# Patient Record
Sex: Male | Born: 1985 | Race: Black or African American | Hispanic: No | Marital: Single | Smoking: Current every day smoker
Health system: Southern US, Community
[De-identification: ages and names within clinical notes are randomized; demographics above are authoritative.]

## PROBLEM LIST (undated history)

## (undated) DIAGNOSIS — I10 Essential (primary) hypertension: Secondary | ICD-10-CM

## (undated) HISTORY — PX: INCISE AND DRAIN ABCESS: PRO64

---

## 2018-01-26 ENCOUNTER — Encounter (HOSPITAL_COMMUNITY): Payer: Self-pay | Admitting: Emergency Medicine

## 2018-01-26 ENCOUNTER — Other Ambulatory Visit: Payer: Self-pay

## 2018-01-26 ENCOUNTER — Emergency Department (HOSPITAL_COMMUNITY)
Admission: EM | Admit: 2018-01-26 | Discharge: 2018-01-26 | Disposition: A | Discharge status: Home / Self Care | Payer: Medicaid Other | Attending: Emergency Medicine | Admitting: Emergency Medicine

## 2018-01-26 DIAGNOSIS — M545 Low back pain: Secondary | ICD-10-CM | POA: Diagnosis present

## 2018-01-26 DIAGNOSIS — M5442 Lumbago with sciatica, left side: Secondary | ICD-10-CM | POA: Insufficient documentation

## 2018-01-26 DIAGNOSIS — I10 Essential (primary) hypertension: Secondary | ICD-10-CM | POA: Insufficient documentation

## 2018-01-26 DIAGNOSIS — F17210 Nicotine dependence, cigarettes, uncomplicated: Secondary | ICD-10-CM | POA: Diagnosis not present

## 2018-01-26 HISTORY — DX: Essential (primary) hypertension: I10

## 2018-01-26 MED ORDER — METHOCARBAMOL 500 MG PO TABS: 500.0000 mg | ORAL_TABLET | Freq: Two times a day (BID) | ORAL | 0 refills | Status: DC

## 2018-01-26 MED ORDER — PREDNISONE 20 MG PO TABS
60.0000 mg | ORAL_TABLET | Freq: Once | ORAL | Status: AC
  Administered 2018-01-26: 60 mg via ORAL
  Filled 2018-01-26: qty 3

## 2018-01-26 MED ORDER — KETOROLAC TROMETHAMINE 60 MG/2ML IM SOLN
15.0000 mg | Freq: Once | INTRAMUSCULAR | Status: AC
  Administered 2018-01-26: 15 mg via INTRAMUSCULAR
  Filled 2018-01-26: qty 2

## 2018-01-26 NOTE — ED Notes (Signed)
ED Provider at bedside. 

## 2018-01-26 NOTE — ED Triage Notes (Signed)
Pt has known DDD and was told he had a bulging disc in his lumbar spine. Pain down left thigh and knee.  No bowel or bladder symptoms. Consistent pain. Hard to lift left leg.

## 2018-01-26 NOTE — ED Notes (Signed)
Pt ambulatory with limp from triage room to exam room.

## 2018-01-26 NOTE — ED Provider Notes (Signed)
[facility] EMERGENCY DEPARTMENT Provider Note   CSN: [id number] Arrival date & time: 01/26/18  1940     History   Chief Complaint Chief Complaint  Patient presents with  . Back Pain    HPI CAIDENCE Paul Rios is a 32 y.o. male presenting for left-sided lower back pain that radiates down to his left thigh.  Patient states that he has had similar pain in the past and has been managed by [facility] for this pain.  Patient states that he last visited [facility] in March and received tramadol, prednisone for his pain which improved.  Patient states that this current episode of pain again 2 weeks ago and has gradually worsened.  Patient states that his pain is a sharp pain that starts in his left gluteal area and radiates down to his left anterior thigh.  Patient has tried over-the-counter inflammatories for pain but states that these have not helped.    Patient denies any new injury/trauma, patient denies fever, urinary incontinence, fecal incontinence, numbness or tingling or weakness.  Patient states that he has increased pain with lifting his left leg, denies numbness or weakness, just states that it is painful when he does so. Patient denies history of diabetes, gastric ulcers, kidney disease.  Patient states that the last time he received steroids was in March.  HPI  Past Medical History:  Diagnosis Date  . Hypertension     There are no active problems to display for this patient.   History reviewed. No pertinent surgical history.      Home Medications    Prior to Admission medications   Medication Sig Start Date End Date Taking? Authorizing Provider  methocarbamol (ROBAXIN) 500 MG tablet Take 1 tablet (500 mg total) by mouth 2 (two) times daily. 01/26/18   Bill Salinas, PA-C    Family History No family history on file.  Social History Social History   Tobacco Use  . Smoking status: Current Every Day Smoker    Packs/day: 0.50      Types: Cigarettes  . Smokeless tobacco: Never Used  Substance Use Topics  . Alcohol use: Not on file  . Drug use: Not on file     Allergies   Augmentin [amoxicillin-pot clavulanate]   Review of Systems Review of Systems  Constitutional: Negative.  Negative for chills, fatigue and fever.  HENT: Negative.   Eyes: Negative.   Respiratory: Negative.  Negative for cough, chest tightness and shortness of breath.   Cardiovascular: Negative.  Negative for chest pain and leg swelling.  Gastrointestinal: Negative.  Negative for abdominal pain, diarrhea, nausea and vomiting.  Genitourinary: Negative.  Negative for difficulty urinating, dysuria and hematuria.  Musculoskeletal: Positive for back pain. Negative for joint swelling, neck pain and neck stiffness.  Skin: Negative.  Negative for rash and wound.  Neurological: Negative.  Negative for dizziness, syncope, weakness, light-headedness, numbness and headaches.     Physical Exam Updated Vital Signs BP 132/83 (BP Location: Right Arm)   Pulse (!) 50   Temp 98.7 F (37.1 C) (Oral)   Resp 18   SpO2 98%   Physical Exam  Constitutional: He is oriented to person, place, and time. He appears well-developed and well-nourished. No distress.  HENT:  Head: Normocephalic and atraumatic.  Right Ear: External ear normal.  Left Ear: External ear normal.  Nose: Nose normal.  Eyes: Pupils are equal, round, and reactive to light.  Neck: Normal range of motion. Neck supple. No JVD present.  No tracheal deviation present.  Cardiovascular: Normal rate and regular rhythm.  Pulmonary/Chest: Effort normal and breath sounds normal. No respiratory distress.  Abdominal: Soft. There is no tenderness. There is no rebound and no guarding.  Musculoskeletal: Normal range of motion.       Back:  Positive straight leg test, left side. No midline spine TTP, no paraspinal muscle tenderness, no deformity, crepitus, or step-off noted. Sensation and strength  fully intact to patient's lower extremities bilaterally.  Patient is able to extend and flex his legs bilaterally.    Neurological: He is alert and oriented to person, place, and time. He has normal strength. No cranial nerve deficit or sensory deficit.  Normal tone. 5/5 strength of BUE and BLE major muscle groups including strong and equal grip strength and dorsiflexion/plantar flexion Sensory: light touch normal in all extremities. Gait: normal gait and balance. CV: 2+ radial and DP/PT pulses  Skin: Skin is warm and dry.  Psychiatric: He has a normal mood and affect. His behavior is normal.     ED Treatments / Results  Labs (all labs ordered are listed, but only abnormal results are displayed) Labs Reviewed - No data to display  EKG None  Radiology No results found.  Procedures Procedures (including critical care time)  Medications Ordered in ED Medications  ketorolac (TORADOL) injection 15 mg (15 mg Intramuscular Given 01/26/18 2123)  predniSONE (DELTASONE) tablet 60 mg (60 mg Oral Given 01/26/18 2124)     Initial Impression / Assessment and Plan / ED Course  I have reviewed the triage vital signs and the nursing notes.  Pertinent labs & imaging results that were available during my care of the patient were reviewed by me and considered in my medical decision making (see chart for details).  Clinical Course as of Jan 28 255  Thu Jan 26, 2018  2221 On reassessment patient is sleeping in room, patient states that his pain is improved and he is ready to be discharged.   [BM]    Clinical Course User Index [BM] Bill Salinas, PA-C   Patient with back pain.  No neurological deficits and normal neuro exam.  Patient can walk but states is painful.  No loss of bowel or bladder control.  No concern for cauda equina.  No fever, night sweats, weight loss, h/o cancer, IVDU.  Patient denies new injury. Patient managed by [facility] this same problem.  Patient states  that he has an appointment with [facility] at the end of this month.  Patient encouraged to keep this appointment.  Pain controlled in the department with Toradol and prednisone.  Patient states that pain is improved at this time and is ready for discharge.  Patient states that he will follow-up with [facility] for this complaint.  I have given the patient a prescription for Robaxin, patient informed not to drive or operate heavy machinery while taking muscle relaxers.  At this time there does not appear to be any evidence of an acute emergency medical condition and the patient appears stable for discharge with appropriate outpatient follow up. Diagnosis was discussed with patient who verbalizes understanding and is agreeable to discharge. I have discussed return precautions with patient and family who verbalize understanding of return precautions. Patient strongly encouraged to follow-up with their PCP.  This note was dictated using DragonOne dictation software; please contact for any inconsistencies within the note.   Final Clinical Impressions(s) / ED Diagnoses   Final diagnoses:  Acute left-sided low back  pain with left-sided sciatica    ED Discharge Orders        Ordered    methocarbamol (ROBAXIN) 500 MG tablet  2 times daily     01/26/18 2223       Elizabeth PalauMorelli, Brandon A, PA-C 01/27/18 0258    Jacalyn LefevreHaviland, Julie, MD 01/31/18 1810

## 2018-01-26 NOTE — Discharge Instructions (Addendum)
Please follow-up with your primary care provider regarding your visit today. [facility] further evaluation of your pain. You may use Robaxin as prescribed for pain, do not drive or operate heavy machinery while taking Robaxin. Please take Ibuprofen (Advil, motrin) and Tylenol (acetaminophen) to relieve your pain.  You may take up to 400 MG (2 pills) of normal strength ibuprofen every 8 hours as needed.  In between doses of ibuprofen you make take tylenol, up to 500 mg (one extra strength pill).  Do not take more than 3,000 mg tylenol in a 24 hour period.  Please check all medication labels as many medications such as pain and cold medications may contain tylenol.  Do not drink alcohol while taking these medications.  Do not take other NSAID'S while taking ibuprofen (such as aleve or naproxen).  Please take ibuprofen with food to decrease stomach upset. Please return to the emergency department for any new or worsening symptoms.  Contact a doctor if: You have pain that: Wakes you up when you are sleeping. Gets worse when you lie down. Is worse than the pain you have had in the past. Lasts longer than 4 weeks. You lose weight for without trying. Get help right away if: You cannot control when you pee (urinate) or poop (have a bowel movement). You have weakness in any of these areas and it gets worse. Lower back. Lower belly (pelvis). Butt (buttocks). Legs. You have redness or swelling of your back. You have a burning feeling when you pee.

## 2018-01-26 NOTE — ED Notes (Signed)
Patient verbalizes understanding of discharge instructions. Opportunity for questioning and answers were provided. Armband removed by staff, pt discharged from ED ambulatory.   

## 2018-01-31 ENCOUNTER — Other Ambulatory Visit: Payer: Self-pay | Admitting: Orthopedic Surgery

## 2018-01-31 DIAGNOSIS — M5416 Radiculopathy, lumbar region: Secondary | ICD-10-CM

## 2018-02-01 ENCOUNTER — Ambulatory Visit
Admission: RE | Admit: 2018-02-01 | Discharge: 2018-02-01 | Disposition: A | Discharge status: Home / Self Care | Payer: Medicaid Other | Source: Ambulatory Visit | Attending: Orthopedic Surgery | Admitting: Orthopedic Surgery

## 2018-02-01 DIAGNOSIS — M5416 Radiculopathy, lumbar region: Secondary | ICD-10-CM

## 2018-03-16 ENCOUNTER — Other Ambulatory Visit: Payer: Self-pay | Admitting: Orthopedic Surgery

## 2018-03-17 NOTE — Pre-Procedure Instructions (Signed)
Paul Rios  03/17/2018      [id number] [place]- [place], [place] - [address] [facility] [address] [place] [id number] Phone: [phone] Fax: [phone]    Your procedure is scheduled on September 4  [facility].M[facility]  Call this number if you have problems the morning of surgery:  [phone]   Remember:  Do not eat or drink after midnight.     Take these medicines the morning of surgery with A SIP OF WATER  gabapentin (NEURONTIN)  HYDROcodone-acetaminophen (NORCO)   7 days prior to surgery STOP taking any Aspirin(unless otherwise instructed by your surgeon), Aleve, Naproxen, Ibuprofen, Motrin, Advil, Goody's, BC's, all herbal medications, fish oil, and all vitamins     Do not wear jewelry  Do not wear lotions, powders, or cologne, or deodorant.  Men may shave face and neck.  Do not bring valuables to the hospital.  [facility] is not responsible for any belongings or valuables.  Contacts, dentures or bridgework may not be worn into surgery.  Leave your suitcase in the car.  After surgery it may be brought to your room.  For patients admitted to the hospital, discharge time will be determined by your treatment team.  Patients discharged the day of surgery will not be allowed to drive home.    Special instructions:   [facility]- Preparing For Surgery  Before surgery, you can play an important role. Because skin is not sterile, your skin needs to be as free of germs as possible. You can reduce the number of germs on your skin by washing with CHG (chlorahexidine gluconate) Soap before surgery.  CHG is an antiseptic cleaner which kills germs and bonds with the skin to continue killing germs even after washing.    Oral Hygiene is also important to reduce your risk of infection.  Remember - BRUSH YOUR TEETH THE MORNING OF SURGERY WITH YOUR REGULAR TOOTHPASTE  Please do not  use if you have an allergy to CHG or antibacterial soaps. If your skin becomes reddened/irritated stop using the CHG.  Do not shave (including legs and underarms) for at least 48 hours prior to first CHG shower. It is OK to shave your face.  Please follow these instructions carefully.   1. Shower the NIGHT BEFORE SURGERY and the MORNING OF SURGERY with CHG.   2. If you chose to wash your hair, wash your hair first as usual with your normal shampoo.  3. After you shampoo, rinse your hair and body thoroughly to remove the shampoo.  4. Use CHG as you would any other liquid soap. You can apply CHG directly to the skin and wash gently with a scrungie or a clean washcloth.   5. Apply the CHG Soap to your body ONLY FROM THE NECK DOWN.  Do not use on open wounds or open sores. Avoid contact with your eyes, ears, mouth and genitals (private parts). Wash Face and genitals (private parts)  with your normal soap.  6. Wash thoroughly, paying special attention to the area where your surgery will be performed.  7. Thoroughly rinse your body with warm water from the neck down.  8. DO NOT shower/wash with your normal soap after using and rinsing off the CHG Soap.  9. Pat yourself dry with a CLEAN TOWEL.  10. Wear CLEAN PAJAMAS to bed the night before surgery, wear comfortable clothes  the morning of surgery  11. Place CLEAN SHEETS on your bed the night of your first shower and DO NOT SLEEP WITH PETS.    Day of Surgery:  Do not apply any deodorants/lotions.  Please wear clean clothes to the hospital/surgery center.   Remember to brush your teeth WITH YOUR REGULAR TOOTHPASTE.    Please read over the following fact sheets that you were given.

## 2018-03-21 ENCOUNTER — Other Ambulatory Visit: Payer: Self-pay

## 2018-03-21 ENCOUNTER — Encounter (HOSPITAL_COMMUNITY)
Admission: RE | Admit: 2018-03-21 | Discharge: 2018-03-21 | Disposition: A | Discharge status: Home / Self Care | Payer: Medicaid Other | Source: Ambulatory Visit | Attending: Orthopedic Surgery | Admitting: Orthopedic Surgery

## 2018-03-21 ENCOUNTER — Ambulatory Visit (HOSPITAL_COMMUNITY)
Admission: RE | Admit: 2018-03-21 | Discharge: 2018-03-21 | Disposition: A | Discharge status: Home / Self Care | Payer: Medicaid Other | Source: Ambulatory Visit | Attending: Orthopedic Surgery | Admitting: Orthopedic Surgery

## 2018-03-21 ENCOUNTER — Encounter (HOSPITAL_COMMUNITY): Payer: Self-pay

## 2018-03-21 DIAGNOSIS — R9431 Abnormal electrocardiogram [ECG] [EKG]: Secondary | ICD-10-CM | POA: Insufficient documentation

## 2018-03-21 DIAGNOSIS — Z01818 Encounter for other preprocedural examination: Secondary | ICD-10-CM

## 2018-03-21 LAB — TYPE AND SCREEN
ABO/RH(D): A POS
Antibody Screen: NEGATIVE

## 2018-03-21 LAB — URINALYSIS, ROUTINE W REFLEX MICROSCOPIC
Bilirubin Urine: NEGATIVE
GLUCOSE, UA: NEGATIVE mg/dL
HGB URINE DIPSTICK: NEGATIVE
KETONES UR: NEGATIVE mg/dL
Leukocytes, UA: NEGATIVE
Nitrite: NEGATIVE
PROTEIN: NEGATIVE mg/dL
Specific Gravity, Urine: 1.018 (ref 1.005–1.030)
pH: 7 (ref 5.0–8.0)

## 2018-03-21 LAB — CBC WITH DIFFERENTIAL/PLATELET
ABS IMMATURE GRANULOCYTES: 0 10*3/uL (ref 0.0–0.1)
BASOS ABS: 0.1 10*3/uL (ref 0.0–0.1)
Basophils Relative: 1 %
Eosinophils Absolute: 0.1 10*3/uL (ref 0.0–0.7)
Eosinophils Relative: 2 %
HCT: 44.3 % (ref 39.0–52.0)
Hemoglobin: 14.5 g/dL (ref 13.0–17.0)
IMMATURE GRANULOCYTES: 0 %
Lymphocytes Relative: 30 %
Lymphs Abs: 2.4 10*3/uL (ref 0.7–4.0)
MCH: 28.8 pg (ref 26.0–34.0)
MCHC: 32.7 g/dL (ref 30.0–36.0)
MCV: 87.9 fL (ref 78.0–100.0)
MONO ABS: 0.6 10*3/uL (ref 0.1–1.0)
Monocytes Relative: 7 %
Neutro Abs: 4.9 10*3/uL (ref 1.7–7.7)
Neutrophils Relative %: 60 %
PLATELETS: 215 10*3/uL (ref 150–400)
RBC: 5.04 MIL/uL (ref 4.22–5.81)
RDW: 12.6 % (ref 11.5–15.5)
WBC: 8.1 10*3/uL (ref 4.0–10.5)

## 2018-03-21 LAB — COMPREHENSIVE METABOLIC PANEL
ALBUMIN: 3.9 g/dL (ref 3.5–5.0)
ALT: 28 U/L (ref 0–44)
AST: 29 U/L (ref 15–41)
Alkaline Phosphatase: 41 U/L (ref 38–126)
Anion gap: 7 (ref 5–15)
BUN: 9 mg/dL (ref 6–20)
CHLORIDE: 107 mmol/L (ref 98–111)
CO2: 26 mmol/L (ref 22–32)
Calcium: 9.3 mg/dL (ref 8.9–10.3)
Creatinine, Ser: 1.1 mg/dL (ref 0.61–1.24)
GFR calc non Af Amer: >60 mL/min (ref >60)
GLUCOSE: 93 mg/dL (ref 70–99)
Potassium: 3.9 mmol/L (ref 3.5–5.1)
SODIUM: 140 mmol/L (ref 135–145)
Total Bilirubin: 0.5 mg/dL (ref 0.3–1.2)
Total Protein: 7 g/dL (ref 6.5–8.1)

## 2018-03-21 LAB — SURGICAL PCR SCREEN
MRSA, PCR: NEGATIVE
Staphylococcus aureus: POSITIVE — AB

## 2018-03-21 LAB — APTT: APTT: 33 s (ref 24–36)

## 2018-03-21 LAB — ABO/RH: ABO/RH(D): A POS

## 2018-03-21 LAB — PROTIME-INR
INR: 1
Prothrombin Time: 13.1 seconds (ref 11.4–15.2)

## 2018-03-21 NOTE — Progress Notes (Signed)
PCP - none  Chest x-ray -  03/21/18 EKG -  03/21/18  Blood Thinner Instructions: N/A Aspirin Instructions:  Anesthesia review: none  Patient denies shortness of breath, fever, cough and chest pain at PAT appointment   Patient verbalized understanding of instructions that were given to them at the PAT appointment. Patient was also instructed that they will need to review over the PAT instructions again at home before surgery.

## 2018-03-21 NOTE — Progress Notes (Signed)
   03/21/18 1107  OBSTRUCTIVE SLEEP APNEA  Have you ever been diagnosed with sleep apnea through a sleep study? No  Do you snore loudly (loud enough to be heard through closed doors)?  1  Do you often feel tired, fatigued, or sleepy during the daytime (such as falling asleep during driving or talking to someone)? 0  Has anyone observed you stop breathing during your sleep? 1  Do you have, or are you being treated for high blood pressure? 0  BMI more than 35 kg/m2? 1  Age > 50 (1-yes) 0  Neck circumference greater than:Male 16 inches or larger, Male 17inches or larger? 1  Male Gender (Yes=1) 1  Obstructive Sleep Apnea Score 5  Score 5 or greater  No PCP

## 2018-03-22 ENCOUNTER — Ambulatory Visit (HOSPITAL_COMMUNITY)
Admission: RE | Disposition: A | Discharge status: Home / Self Care | Payer: Self-pay | Source: Ambulatory Visit | Attending: Orthopedic Surgery

## 2018-03-22 ENCOUNTER — Ambulatory Visit (HOSPITAL_COMMUNITY): Payer: Medicaid Other | Admitting: Anesthesiology

## 2018-03-22 ENCOUNTER — Ambulatory Visit (HOSPITAL_COMMUNITY)
Admission: RE | Admit: 2018-03-22 | Discharge: 2018-03-22 | Disposition: A | Discharge status: Home / Self Care | Payer: Medicaid Other | Source: Ambulatory Visit | Attending: Orthopedic Surgery | Admitting: Orthopedic Surgery

## 2018-03-22 ENCOUNTER — Ambulatory Visit (HOSPITAL_COMMUNITY): Payer: Medicaid Other

## 2018-03-22 ENCOUNTER — Encounter (HOSPITAL_COMMUNITY): Payer: Self-pay | Admitting: Surgery

## 2018-03-22 DIAGNOSIS — F17210 Nicotine dependence, cigarettes, uncomplicated: Secondary | ICD-10-CM | POA: Insufficient documentation

## 2018-03-22 DIAGNOSIS — Z6839 Body mass index (BMI) 39.0-39.9, adult: Secondary | ICD-10-CM | POA: Insufficient documentation

## 2018-03-22 DIAGNOSIS — Z880 Allergy status to penicillin: Secondary | ICD-10-CM | POA: Insufficient documentation

## 2018-03-22 DIAGNOSIS — M5117 Intervertebral disc disorders with radiculopathy, lumbosacral region: Secondary | ICD-10-CM | POA: Diagnosis not present

## 2018-03-22 DIAGNOSIS — I10 Essential (primary) hypertension: Secondary | ICD-10-CM | POA: Insufficient documentation

## 2018-03-22 DIAGNOSIS — Z79899 Other long term (current) drug therapy: Secondary | ICD-10-CM | POA: Diagnosis not present

## 2018-03-22 DIAGNOSIS — Z419 Encounter for procedure for purposes other than remedying health state, unspecified: Secondary | ICD-10-CM

## 2018-03-22 HISTORY — PX: LUMBAR LAMINECTOMY/DECOMPRESSION MICRODISCECTOMY: SHX5026

## 2018-03-22 SURGERY — LUMBAR LAMINECTOMY/DECOMPRESSION MICRODISCECTOMY
Anesthesia: General | Site: Spine Lumbar | Laterality: Left

## 2018-03-22 MED ORDER — LACTATED RINGERS IV SOLN
INTRAVENOUS | Status: DC | PRN
  Administered 2018-03-22 (×2): via INTRAVENOUS

## 2018-03-22 MED ORDER — ROCURONIUM BROMIDE 10 MG/ML (PF) SYRINGE
PREFILLED_SYRINGE | INTRAVENOUS | Status: DC | PRN
  Administered 2018-03-22: 50 mg via INTRAVENOUS
  Administered 2018-03-22: 10 mg via INTRAVENOUS
  Administered 2018-03-22: 20 mg via INTRAVENOUS
  Administered 2018-03-22: 10 mg via INTRAVENOUS

## 2018-03-22 MED ORDER — METHYLENE BLUE 0.5 % INJ SOLN
INTRAVENOUS | Status: AC
  Filled 2018-03-22: qty 10

## 2018-03-22 MED ORDER — MIDAZOLAM HCL 2 MG/2ML IJ SOLN
INTRAMUSCULAR | Status: AC
  Filled 2018-03-22: qty 2

## 2018-03-22 MED ORDER — SUGAMMADEX SODIUM 200 MG/2ML IV SOLN
INTRAVENOUS | Status: DC | PRN
  Administered 2018-03-22: 200 mg via INTRAVENOUS

## 2018-03-22 MED ORDER — OXYCODONE HCL 5 MG PO TABS: 5.0000 mg | ORAL_TABLET | Freq: Once | ORAL | Status: DC | PRN

## 2018-03-22 MED ORDER — BUPIVACAINE LIPOSOME 1.3 % IJ SUSP
20.0000 mL | Freq: Once | INTRAMUSCULAR | Status: DC
  Filled 2018-03-22: qty 20

## 2018-03-22 MED ORDER — VANCOMYCIN HCL IN DEXTROSE 1-5 GM/200ML-% IV SOLN
1000.0000 mg | INTRAVENOUS | Status: AC
  Administered 2018-03-22: 1000 mg via INTRAVENOUS

## 2018-03-22 MED ORDER — OXYCODONE HCL 5 MG/5ML PO SOLN: 5.0000 mg | Freq: Once | ORAL | Status: DC | PRN

## 2018-03-22 MED ORDER — PROPOFOL 10 MG/ML IV BOLUS
INTRAVENOUS | Status: AC
  Filled 2018-03-22: qty 20

## 2018-03-22 MED ORDER — VANCOMYCIN HCL IN DEXTROSE 1-5 GM/200ML-% IV SOLN
INTRAVENOUS | Status: AC
  Administered 2018-03-22: 1000 mg via INTRAVENOUS
  Filled 2018-03-22: qty 200

## 2018-03-22 MED ORDER — BUPIVACAINE-EPINEPHRINE 0.25% -1:200000 IJ SOLN
INTRAMUSCULAR | Status: DC | PRN
  Administered 2018-03-22: 22 mL
  Administered 2018-03-22: 8 mL

## 2018-03-22 MED ORDER — BUPIVACAINE LIPOSOME 1.3 % IJ SUSP
INTRAMUSCULAR | Status: DC | PRN
  Administered 2018-03-22: 20 mL

## 2018-03-22 MED ORDER — SUFENTANIL CITRATE 50 MCG/ML IV SOLN
INTRAVENOUS | Status: DC | PRN
  Administered 2018-03-22: 10 ug via INTRAVENOUS
  Administered 2018-03-22: 20 ug via INTRAVENOUS
  Administered 2018-03-22 (×2): 5 ug via INTRAVENOUS
  Administered 2018-03-22: 10 ug via INTRAVENOUS

## 2018-03-22 MED ORDER — EPHEDRINE 5 MG/ML INJ
INTRAVENOUS | Status: AC
  Filled 2018-03-22: qty 10

## 2018-03-22 MED ORDER — ROCURONIUM BROMIDE 50 MG/5ML IV SOSY
PREFILLED_SYRINGE | INTRAVENOUS | Status: AC
  Filled 2018-03-22: qty 5

## 2018-03-22 MED ORDER — METHYLPREDNISOLONE ACETATE 40 MG/ML IJ SUSP
INTRAMUSCULAR | Status: AC
  Filled 2018-03-22: qty 1

## 2018-03-22 MED ORDER — DEXAMETHASONE SODIUM PHOSPHATE 10 MG/ML IJ SOLN
INTRAMUSCULAR | Status: AC
  Filled 2018-03-22: qty 1

## 2018-03-22 MED ORDER — HEMOSTATIC AGENTS (NO CHARGE) OPTIME
TOPICAL | Status: DC | PRN
  Administered 2018-03-22: 1 via TOPICAL

## 2018-03-22 MED ORDER — EPHEDRINE SULFATE-NACL 50-0.9 MG/10ML-% IV SOSY
PREFILLED_SYRINGE | INTRAVENOUS | Status: DC | PRN
  Administered 2018-03-22: 10 mg via INTRAVENOUS

## 2018-03-22 MED ORDER — HYDROMORPHONE HCL 1 MG/ML IJ SOLN
0.2500 mg | INTRAMUSCULAR | Status: DC | PRN
  Administered 2018-03-22 (×2): 0.5 mg via INTRAVENOUS

## 2018-03-22 MED ORDER — PROPOFOL 10 MG/ML IV BOLUS
INTRAVENOUS | Status: DC | PRN
  Administered 2018-03-22: 370 mg via INTRAVENOUS

## 2018-03-22 MED ORDER — THROMBIN 20000 UNITS EX SOLR
CUTANEOUS | Status: DC | PRN
  Administered 2018-03-22: 20 mL via TOPICAL

## 2018-03-22 MED ORDER — METHYLENE BLUE 0.5 % INJ SOLN
INTRAVENOUS | Status: DC | PRN
  Administered 2018-03-22: 1 mL via INTRADERMAL

## 2018-03-22 MED ORDER — SODIUM CHLORIDE 0.9 % IJ SOLN
INTRAMUSCULAR | Status: AC
  Filled 2018-03-22: qty 10

## 2018-03-22 MED ORDER — LIDOCAINE 2% (20 MG/ML) 5 ML SYRINGE
INTRAMUSCULAR | Status: AC
  Filled 2018-03-22: qty 5

## 2018-03-22 MED ORDER — ONDANSETRON HCL 4 MG/2ML IJ SOLN
INTRAMUSCULAR | Status: DC | PRN
  Administered 2018-03-22: 4 mg via INTRAVENOUS

## 2018-03-22 MED ORDER — SUFENTANIL CITRATE 50 MCG/ML IV SOLN
INTRAVENOUS | Status: AC
  Filled 2018-03-22: qty 1

## 2018-03-22 MED ORDER — ONDANSETRON HCL 4 MG/2ML IJ SOLN
INTRAMUSCULAR | Status: AC
  Filled 2018-03-22: qty 2

## 2018-03-22 MED ORDER — HYDROMORPHONE HCL 1 MG/ML IJ SOLN
INTRAMUSCULAR | Status: AC
  Filled 2018-03-22: qty 1

## 2018-03-22 MED ORDER — BUPIVACAINE-EPINEPHRINE (PF) 0.25% -1:200000 IJ SOLN
INTRAMUSCULAR | Status: AC
  Filled 2018-03-22: qty 30

## 2018-03-22 MED ORDER — POVIDONE-IODINE 7.5 % EX SOLN: Freq: Once | CUTANEOUS | Status: DC

## 2018-03-22 MED ORDER — ONDANSETRON HCL 4 MG/2ML IJ SOLN: 4.0000 mg | Freq: Four times a day (QID) | INTRAMUSCULAR | Status: DC | PRN

## 2018-03-22 MED ORDER — THROMBIN (RECOMBINANT) 20000 UNITS EX SOLR
CUTANEOUS | Status: AC
  Filled 2018-03-22: qty 20000

## 2018-03-22 MED ORDER — DEXAMETHASONE SODIUM PHOSPHATE 10 MG/ML IJ SOLN
INTRAMUSCULAR | Status: DC | PRN
  Administered 2018-03-22: 10 mg via INTRAVENOUS

## 2018-03-22 MED ORDER — 0.9 % SODIUM CHLORIDE (POUR BTL) OPTIME
TOPICAL | Status: DC | PRN
  Administered 2018-03-22: 1000 mL

## 2018-03-22 MED ORDER — MIDAZOLAM HCL 5 MG/5ML IJ SOLN
INTRAMUSCULAR | Status: DC | PRN
  Administered 2018-03-22: 2 mg via INTRAVENOUS

## 2018-03-22 SURGICAL SUPPLY — 72 items
BENZOIN TINCTURE PRP APPL 2/3 (GAUZE/BANDAGES/DRESSINGS) ×3 IMPLANT
BUR PRECISION FLUTE 5.0 (BURR) ×3 IMPLANT
CABLE BIPOLOR RESECTION CORD (MISCELLANEOUS) ×3 IMPLANT
CANISTER SUCT 3000ML PPV (MISCELLANEOUS) ×3 IMPLANT
CARTRIDGE OIL MAESTRO DRILL (MISCELLANEOUS) ×1 IMPLANT
CLOSURE STERI-STRIP 1/2X4 (GAUZE/BANDAGES/DRESSINGS) ×1
CLOSURE WOUND 1/2 X4 (GAUZE/BANDAGES/DRESSINGS)
CLSR STERI-STRIP ANTIMIC 1/2X4 (GAUZE/BANDAGES/DRESSINGS) ×2 IMPLANT
COVER SURGICAL LIGHT HANDLE (MISCELLANEOUS) ×3 IMPLANT
DIFFUSER DRILL AIR PNEUMATIC (MISCELLANEOUS) ×3 IMPLANT
DRAIN CHANNEL 15F RND FF W/TCR (WOUND CARE) IMPLANT
DRAPE POUCH INSTRU U-SHP 10X18 (DRAPES) ×6 IMPLANT
DRAPE SURG 17X23 STRL (DRAPES) ×12 IMPLANT
DURAPREP 26ML APPLICATOR (WOUND CARE) ×3 IMPLANT
ELECT BLADE 4.0 EZ CLEAN MEGAD (MISCELLANEOUS) ×3
ELECT CAUTERY BLADE 6.4 (BLADE) ×3 IMPLANT
ELECT REM PT RETURN 9FT ADLT (ELECTROSURGICAL) ×3
ELECTRODE BLDE 4.0 EZ CLN MEGD (MISCELLANEOUS) ×1 IMPLANT
ELECTRODE REM PT RTRN 9FT ADLT (ELECTROSURGICAL) ×1 IMPLANT
EVACUATOR SILICONE 100CC (DRAIN) IMPLANT
FILTER STRAW FLUID ASPIR (MISCELLANEOUS) ×3 IMPLANT
GAUZE 4X4 16PLY RFD (DISPOSABLE) ×6 IMPLANT
GAUZE SPONGE 4X4 12PLY STRL (GAUZE/BANDAGES/DRESSINGS) ×3 IMPLANT
GAUZE SPONGE 4X4 12PLY STRL LF (GAUZE/BANDAGES/DRESSINGS) ×3 IMPLANT
GLOVE BIO SURGEON STRL SZ7 (GLOVE) ×3 IMPLANT
GLOVE BIO SURGEON STRL SZ8 (GLOVE) ×3 IMPLANT
GLOVE BIOGEL PI IND STRL 7.0 (GLOVE) ×1 IMPLANT
GLOVE BIOGEL PI IND STRL 8 (GLOVE) ×1 IMPLANT
GLOVE BIOGEL PI INDICATOR 7.0 (GLOVE) ×2
GLOVE BIOGEL PI INDICATOR 8 (GLOVE) ×2
GOWN STRL REUS W/ TWL LRG LVL3 (GOWN DISPOSABLE) ×1 IMPLANT
GOWN STRL REUS W/ TWL XL LVL3 (GOWN DISPOSABLE) ×2 IMPLANT
GOWN STRL REUS W/TWL LRG LVL3 (GOWN DISPOSABLE) ×2
GOWN STRL REUS W/TWL XL LVL3 (GOWN DISPOSABLE) ×4
IV CATH 14GX2 1/4 (CATHETERS) ×3 IMPLANT
KIT BASIN OR (CUSTOM PROCEDURE TRAY) ×3 IMPLANT
KIT POSITION SURG JACKSON T1 (MISCELLANEOUS) ×3 IMPLANT
KIT TURNOVER KIT B (KITS) ×3 IMPLANT
NEEDLE 18GX1X1/2 (RX/OR ONLY) (NEEDLE) ×3 IMPLANT
NEEDLE 22X1 1/2 (OR ONLY) (NEEDLE) ×3 IMPLANT
NEEDLE HYPO 25GX1X1/2 BEV (NEEDLE) ×3 IMPLANT
NEEDLE SPNL 18GX3.5 QUINCKE PK (NEEDLE) ×6 IMPLANT
NS IRRIG 1000ML POUR BTL (IV SOLUTION) ×3 IMPLANT
OIL CARTRIDGE MAESTRO DRILL (MISCELLANEOUS) ×3
PACK LAMINECTOMY ORTHO (CUSTOM PROCEDURE TRAY) ×3 IMPLANT
PACK UNIVERSAL I (CUSTOM PROCEDURE TRAY) ×3 IMPLANT
PAD ARMBOARD 7.5X6 YLW CONV (MISCELLANEOUS) ×6 IMPLANT
PATTIES SURGICAL .5 X.5 (GAUZE/BANDAGES/DRESSINGS) IMPLANT
PATTIES SURGICAL .5 X1 (DISPOSABLE) ×3 IMPLANT
SPONGE INTESTINAL PEANUT (DISPOSABLE) ×3 IMPLANT
SPONGE SURGIFOAM ABS GEL 100 (HEMOSTASIS) ×3 IMPLANT
SPONGE SURGIFOAM ABS GEL SZ50 (HEMOSTASIS) ×3 IMPLANT
STRIP CLOSURE SKIN 1/2X4 (GAUZE/BANDAGES/DRESSINGS) IMPLANT
SURGIFLO W/THROMBIN 8M KIT (HEMOSTASIS) ×3 IMPLANT
SUT MNCRL AB 4-0 PS2 18 (SUTURE) ×3 IMPLANT
SUT VIC AB 0 CT1 18XCR BRD 8 (SUTURE) IMPLANT
SUT VIC AB 0 CT1 27 (SUTURE)
SUT VIC AB 0 CT1 27XBRD ANBCTR (SUTURE) IMPLANT
SUT VIC AB 0 CT1 8-18 (SUTURE)
SUT VIC AB 1 CT1 18XCR BRD 8 (SUTURE) ×1 IMPLANT
SUT VIC AB 1 CT1 8-18 (SUTURE) ×2
SUT VIC AB 2-0 CT2 18 VCP726D (SUTURE) ×3 IMPLANT
SYR 20CC LL (SYRINGE) ×3 IMPLANT
SYR BULB IRRIGATION 50ML (SYRINGE) ×3 IMPLANT
SYR CONTROL 10ML LL (SYRINGE) ×6 IMPLANT
SYR TB 1ML 26GX3/8 SAFETY (SYRINGE) ×6 IMPLANT
SYR TB 1ML LUER SLIP (SYRINGE) ×6 IMPLANT
TAPE CLOTH SURG 6X10 WHT LF (GAUZE/BANDAGES/DRESSINGS) ×3 IMPLANT
TOWEL GREEN STERILE (TOWEL DISPOSABLE) ×3 IMPLANT
TOWEL GREEN STERILE FF (TOWEL DISPOSABLE) ×3 IMPLANT
WATER STERILE IRR 1000ML POUR (IV SOLUTION) ×3 IMPLANT
YANKAUER SUCT BULB TIP NO VENT (SUCTIONS) ×3 IMPLANT

## 2018-03-22 NOTE — Anesthesia Procedure Notes (Signed)
Procedure Name: Intubation Date/Time: 03/22/2018 8:33 AM Performed by: Moshe Salisbury, CRNA Pre-anesthesia Checklist: Patient identified, Emergency Drugs available, Suction available and Patient being monitored Patient Re-evaluated:Patient Re-evaluated prior to induction Oxygen Delivery Method: Circle System Utilized Preoxygenation: Pre-oxygenation with 100% oxygen Induction Type: IV induction Ventilation: Mask ventilation without difficulty Laryngoscope Size: Mac and 4 Grade View: Grade II Tube type: Oral Tube size: 8.0 mm Number of attempts: 1 Airway Equipment and Method: Stylet Placement Confirmation: ETT inserted through vocal cords under direct vision,  positive ETCO2 and breath sounds checked- equal and bilateral Secured at: 23 cm Tube secured with: Tape Dental Injury: Teeth and Oropharynx as per pre-operative assessment

## 2018-03-22 NOTE — H&P (Signed)
PREOPERATIVE H&P  Chief Complaint: Left leg pain  HPI: Paul Rios is a 32 y.o. male who presents with ongoing pain in the left leg  MRI reveals a large left L5/S1 HNP, compressing the left S1 nerve  Patient has failed multiple forms of conservative care and continues to have pain (see office notes for additional details regarding the patient's full course of treatment)  Past Medical History:  Diagnosis Date  . Hypertension    Past Surgical History:  Procedure Laterality Date  . INCISE AND DRAIN ABCESS     in mouth, 2015   Social History   Socioeconomic History  . Marital status: Single    Spouse name: Not on file  . Number of children: Not on file  . Years of education: Not on file  . Highest education level: Not on file  Occupational History  . Not on file  Social Needs  . Financial resource strain: Not on file  . Food insecurity:    Worry: Not on file    Inability: Not on file  . Transportation needs:    Medical: Not on file    Non-medical: Not on file  Tobacco Use  . Smoking status: Current Every Day Smoker    Packs/day: 0.50    Types: Cigarettes  . Smokeless tobacco: Never Used  Substance and Sexual Activity  . Alcohol use: Never    Frequency: Never  . Drug use: Yes    Types: Marijuana  . Sexual activity: Not on file  Lifestyle  . Physical activity:    Days per week: Not on file    Minutes per session: Not on file  . Stress: Not on file  Relationships  . Social connections:    Talks on phone: Not on file    Gets together: Not on file    Attends religious service: Not on file    Active member of club or organization: Not on file    Attends meetings of clubs or organizations: Not on file    Relationship status: Not on file  Other Topics Concern  . Not on file  Social History Narrative  . Not on file   History reviewed. No pertinent family history. Allergies  Allergen Reactions  . Augmentin [Amoxicillin-Pot Clavulanate] Other (See  Comments)    Urinating blood, felt like insides were swelling, severe pain   Prior to Admission medications   Medication Sig Start Date End Date Taking? Authorizing Provider  gabapentin (NEURONTIN) 600 MG tablet Take 300-600 mg by mouth See admin instructions. 300 mg twice daily, 600 mg at bedtime   Yes [provider]  HYDROcodone-acetaminophen (NORCO) 10-325 MG tablet Take 1 tablet by mouth 2 (two) times daily as needed for moderate pain. 03/03/18  Yes [provider]     All other systems have been reviewed and were otherwise negative with the exception of those mentioned in the HPI and as above.  Physical Exam: Vitals:   03/22/18 0642  BP: (!) 142/105  Pulse: (!) 56  Resp: 20  Temp: 98.8 F (37.1 C)  SpO2: 98%    Body mass index is 39.84 kg/m.  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

## 2018-03-22 NOTE — Transfer of Care (Signed)
Immediate Anesthesia Transfer of Care Note  Patient: Paul Rios  Procedure(s) Performed: LEFT-SIDED LUMBAR 5-SACRUM 1 MICRODISECTOMY (Left Spine Lumbar)  Patient Location: PACU  Anesthesia Type:General  Level of Consciousness: awake  Airway & Oxygen Therapy: Patient Spontanous Breathing and Patient connected to nasal cannula oxygen  Post-op Assessment: Report given to RN, Post -op Vital signs reviewed and stable and Patient moving all extremities  Post vital signs: Reviewed and stable  Last Vitals:  Vitals Value Taken Time  BP 145/69 03/22/2018 10:56 AM  Temp    Pulse 76 03/22/2018 10:57 AM  Resp 10 03/22/2018 10:57 AM  SpO2 100 % 03/22/2018 10:57 AM  Vitals shown include unvalidated device data.  Last Pain:  Vitals:   03/22/18 0645  TempSrc:   PainSc: 0-No pain      Patients Stated Pain Goal: 3 (03/22/18 0645)  Complications: No apparent anesthesia complications

## 2018-03-22 NOTE — Op Note (Signed)
NAME:  Paul Rios          MEDICAL RECORD NO.:  [id number]  PHYSICIAN:  Estill Bamberg, MD      DATE OF BIRTH:  [birth year]  DATE OF PROCEDURE:  03/22/2018                              OPERATIVE REPORT   PREOPERATIVE DIAGNOSES: 1. Left-sided S1 radiculopathy. 2. 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  POSTOPERATIVE DIAGNOSES: 1. Left-sided S1 radiculopathy. 2. 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  PROCEDURES:  Left-sided L5-S1 laminotomy with partial facetectomy and removal of large herniated left-sided L5-S1 disk fragment.  SURGEON:  Estill Bamberg, MD.  ASSISTANTJason Coop, PA-C.  ANESTHESIA:  General endotracheal anesthesia.  COMPLICATIONS:  None.  DISPOSITION:  Stable.  ESTIMATED BLOOD LOSS:  Minimal.  INDICATIONS FOR SURGERY:  Briefly, Paul Rios is a pleasant 32 year old patient, who did present to me with severe pain in the left leg.  The patient's MRI did reveal the findings outlined above, clearly notable for a large herniated disk fragment. The pain was rather severe and he did have weakness in his leg as well.  We did discuss treatment options and we did ultimately elect to proceed with the procedure reflected above.  The patient was fully made aware of the risks of surgery, including the risk of recurrent herniation and the need for subsequent surgery, including the possibility of a subsequent diskectomy and/or fusion.  OPERATIVE DETAILS:  On 03/22/2018, the patient was brought to surgery and general endotracheal anesthesia was administered.  The patient was placed prone on a well-padded flat Jackson bed with a spinal frame.  Antibiotics were given.  The back was prepped and draped and a time-out procedure was performed.  At this point, a midline incision was made directly over the L5-S1 intervertebral space.  A  curvilinear incision was made just to the left of the midline into the fascia.  A self-retaining McCulloch retractor was placed.  The lamina of L5 and S1 was identified and subperiosteally exposed.  I then removed the lateral aspect of the L5-S1 ligamentum flavum.  Readily identified was the traversing left S1 nerve, which was noted to be under obvious tension, and was noted to be rather erythematous.  I was able to gently gain medial retraction of the nerve, and in doing so, a very large herniated disk fragment was readily noted.  This was removed in 3-4 large fragments.    At this point, the left S1 nerve was evaluated, and was noted to be free of tension.   I was very pleased with the final decompression that I was able to accomplish.  At this point, the wound was copiously irrigated with normal saline.  All epidural bleeding was controlled using bipolar electrocautery in addition to Surgiflo. All bleeding was controlled at the termination of the procedure.  At this point, 20 mg of Depo-Medrol was introduced about the epidural space in the region of the left S1 nerve.  The wound was then closed in layers using #1 Vicryl followed by 0 Vicryl, followed by 4-0 Monocryl. Benzoin and Steri-Strips were applied followed by a sterile dressing. All instrument counts were correct at the termination of the procedure.  Of note, Jason Coop was my assistant throughout surgery, and did aid in retraction,  suctioning, and closure from start to finish.     Estill Bamberg, MD

## 2018-03-22 NOTE — Anesthesia Preprocedure Evaluation (Signed)
Anesthesia Evaluation  Patient identified by MRN, date of birth, ID band Patient awake    Reviewed: Allergy & Precautions, H&P , NPO status , Patient's Chart, lab work & pertinent test results  Airway Mallampati: II   Neck ROM: full    Dental   Pulmonary Current Smoker,    breath sounds clear to auscultation       Cardiovascular hypertension,  Rhythm:regular Rate:Normal     Neuro/Psych    GI/Hepatic   Endo/Other  Morbid obesity  Renal/GU      Musculoskeletal   Abdominal   Peds  Hematology   Anesthesia Other Findings   Reproductive/Obstetrics                             Anesthesia Physical Anesthesia Plan  ASA: II  Anesthesia Plan: General   Post-op Pain Management:    Induction: Intravenous  PONV Risk Score and Plan: 1 and Ondansetron, Dexamethasone, Midazolam and Treatment may vary due to age or medical condition  Airway Management Planned: Oral ETT  Additional Equipment:   Intra-op Plan:   Post-operative Plan: Extubation in OR  Informed Consent: I have reviewed the patients History and Physical, chart, labs and discussed the procedure including the risks, benefits and alternatives for the proposed anesthesia with the patient or authorized representative who has indicated his/her understanding and acceptance.     Plan Discussed with: Anesthesiologist, Surgeon and CRNA  Anesthesia Plan Comments:         Anesthesia Quick Evaluation

## 2018-03-23 ENCOUNTER — Encounter (HOSPITAL_COMMUNITY): Payer: Self-pay | Admitting: Orthopedic Surgery

## 2018-03-23 MED FILL — Thrombin (Recombinant) For Soln 20000 Unit: CUTANEOUS | Qty: 1 | Status: AC

## 2018-03-24 NOTE — Anesthesia Postprocedure Evaluation (Signed)
Anesthesia Post Note  Patient: Paul Rios  Procedure(s) Performed: LEFT-SIDED LUMBAR 5-SACRUM 1 MICRODISECTOMY (Left Spine Lumbar)     Patient location during evaluation: PACU Anesthesia Type: General Level of consciousness: awake and alert Pain management: pain level controlled Vital Signs Assessment: post-procedure vital signs reviewed and stable Respiratory status: spontaneous breathing, nonlabored ventilation, respiratory function stable and patient connected to nasal cannula oxygen Cardiovascular status: blood pressure returned to baseline and stable Postop Assessment: no apparent nausea or vomiting Anesthetic complications: no    Last Vitals:  Vitals:   03/22/18 1145 03/22/18 1150  BP:  (!) 149/91  Pulse: 78 71  Resp: 14 15  Temp:    SpO2: 99% 97%    Last Pain:  Vitals:   03/22/18 1150  TempSrc:   PainSc: 3                  Horacio Werth S

## 2018-03-28 ENCOUNTER — Encounter (HOSPITAL_COMMUNITY): Payer: Self-pay | Admitting: *Deleted

## 2018-03-28 ENCOUNTER — Emergency Department (HOSPITAL_COMMUNITY): Payer: Medicaid Other

## 2018-03-28 ENCOUNTER — Emergency Department (HOSPITAL_COMMUNITY)
Admission: EM | Admit: 2018-03-28 | Discharge: 2018-03-28 | Disposition: A | Discharge status: Home / Self Care | Payer: Medicaid Other | Attending: Emergency Medicine | Admitting: Emergency Medicine

## 2018-03-28 DIAGNOSIS — I10 Essential (primary) hypertension: Secondary | ICD-10-CM

## 2018-03-28 DIAGNOSIS — F17210 Nicotine dependence, cigarettes, uncomplicated: Secondary | ICD-10-CM | POA: Insufficient documentation

## 2018-03-28 DIAGNOSIS — J020 Streptococcal pharyngitis: Secondary | ICD-10-CM | POA: Diagnosis not present

## 2018-03-28 DIAGNOSIS — J029 Acute pharyngitis, unspecified: Secondary | ICD-10-CM | POA: Diagnosis present

## 2018-03-28 LAB — CBC WITH DIFFERENTIAL/PLATELET
ABS IMMATURE GRANULOCYTES: 0 10*3/uL (ref 0.0–0.1)
BASOS ABS: 0 10*3/uL (ref 0.0–0.1)
Basophils Relative: 0 %
EOS PCT: 0 %
Eosinophils Absolute: 0.1 10*3/uL (ref 0.0–0.7)
HCT: 41.6 % (ref 39.0–52.0)
Hemoglobin: 13.9 g/dL (ref 13.0–17.0)
Immature Granulocytes: 0 %
LYMPHS PCT: 11 %
Lymphs Abs: 1.4 10*3/uL (ref 0.7–4.0)
MCH: 28.9 pg (ref 26.0–34.0)
MCHC: 33.4 g/dL (ref 30.0–36.0)
MCV: 86.5 fL (ref 78.0–100.0)
Monocytes Absolute: 0.6 10*3/uL (ref 0.1–1.0)
Monocytes Relative: 5 %
NEUTROS ABS: 11.2 10*3/uL — AB (ref 1.7–7.7)
Neutrophils Relative %: 84 %
Platelets: 214 10*3/uL (ref 150–400)
RBC: 4.81 MIL/uL (ref 4.22–5.81)
RDW: 12.7 % (ref 11.5–15.5)
WBC: 13.4 10*3/uL — AB (ref 4.0–10.5)

## 2018-03-28 LAB — COMPREHENSIVE METABOLIC PANEL
ALT: 35 U/L (ref 0–44)
ANION GAP: 10 (ref 5–15)
AST: 36 U/L (ref 15–41)
Albumin: 3.8 g/dL (ref 3.5–5.0)
Alkaline Phosphatase: 45 U/L (ref 38–126)
BILIRUBIN TOTAL: 0.4 mg/dL (ref 0.3–1.2)
BUN: 9 mg/dL (ref 6–20)
CO2: 24 mmol/L (ref 22–32)
Calcium: 9 mg/dL (ref 8.9–10.3)
Chloride: 103 mmol/L (ref 98–111)
Creatinine, Ser: 1.18 mg/dL (ref 0.61–1.24)
Glucose, Bld: 111 mg/dL — ABNORMAL HIGH (ref 70–99)
Potassium: 4 mmol/L (ref 3.5–5.1)
Sodium: 137 mmol/L (ref 135–145)
TOTAL PROTEIN: 7 g/dL (ref 6.5–8.1)

## 2018-03-28 LAB — URINALYSIS, ROUTINE W REFLEX MICROSCOPIC
BILIRUBIN URINE: NEGATIVE
Glucose, UA: NEGATIVE mg/dL
Ketones, ur: NEGATIVE mg/dL
NITRITE: NEGATIVE
PH: 5 (ref 5.0–8.0)
Protein, ur: NEGATIVE mg/dL
SPECIFIC GRAVITY, URINE: 1.015 (ref 1.005–1.030)

## 2018-03-28 LAB — GROUP A STREP BY PCR: GROUP A STREP BY PCR: DETECTED — AB

## 2018-03-28 LAB — I-STAT CG4 LACTIC ACID, ED: Lactic Acid, Venous: 1.27 mmol/L (ref 0.5–1.9)

## 2018-03-28 MED ORDER — ACETAMINOPHEN 325 MG PO TABS
650.0000 mg | ORAL_TABLET | Freq: Once | ORAL | Status: AC
  Administered 2018-03-28: 650 mg via ORAL
  Filled 2018-03-28: qty 2

## 2018-03-28 MED ORDER — CLINDAMYCIN HCL 150 MG PO CAPS: 450.0000 mg | ORAL_CAPSULE | Freq: Three times a day (TID) | ORAL | 0 refills | Status: AC

## 2018-03-28 MED ORDER — DEXAMETHASONE 4 MG PO TABS
10.0000 mg | ORAL_TABLET | Freq: Once | ORAL | Status: AC
  Administered 2018-03-28: 10 mg via ORAL
  Filled 2018-03-28: qty 3

## 2018-03-28 NOTE — ED Triage Notes (Signed)
Pt in c/o possible abscess to the left side of his throat, started having pain after his back surgery on 9/4 and states he did have some sort of tube in his mouth to support his breathing during the procedure, pain has gotten worse with fever

## 2018-03-28 NOTE — ED Notes (Signed)
Pt given UA cup and informed he needs to provide a specimen. Pt verbalized understanding.

## 2018-03-28 NOTE — ED Provider Notes (Addendum)
[facility] EMERGENCY DEPARTMENT Provider Note   CSN: [id number] Arrival date & time: 03/28/18  1021     History   Chief Complaint Chief Complaint  Patient presents with  . Sore Throat    HPI Paul Rios is a 32 y.o. male.  HPI   32 year old male presents today with complaints of sore throat.  Patient notes he woke up this morning with pain in the throat, worse on the left side, low-grade fever and fatigue.  Patient denies any difficulty breathing or swallowing, but notes painful swallowing.  He denies any cough or congestion.  Patient is status post laminectomy discectomy on 03/22/2018.  This is an uncomplicated course, he denies any significant low back pain, denies any swelling redness or discharge from the surgical site.  No medications prior to arrival today.  Patient reports he is otherwise healthy, intermittently having hypertension, not currently on medications.  He denies any close sick contacts but notes that he does have 2 children at home.  Past Medical History:  Diagnosis Date  . Hypertension     There are no active problems to display for this patient.   Past Surgical History:  Procedure Laterality Date  . INCISE AND DRAIN ABCESS     in mouth, 2015  . LUMBAR LAMINECTOMY/DECOMPRESSION MICRODISCECTOMY Left 03/22/2018   Procedure: LEFT-SIDED LUMBAR 5-SACRUM 1 MICRODISECTOMY;  Surgeon: Estill Bamberg, MD;  Location: [facility] OR;  Service: Orthopedics;  Laterality: Left;        Home Medications    Prior to Admission medications   Medication Sig Start Date End Date Taking? Authorizing Provider  clindamycin (CLEOCIN) 150 MG capsule Take 3 capsules (450 mg total) by mouth 3 (three) times daily. 03/28/18   Eyvonne Mechanic, PA-C    Family History History reviewed. No pertinent family history.  Social History Social History   Tobacco Use  . Smoking status: Current Every Day Smoker    Packs/day: 0.50    Types: Cigarettes  . Smokeless tobacco:  Never Used  Substance Use Topics  . Alcohol use: Never    Frequency: Never  . Drug use: Yes    Types: Marijuana     Allergies   Augmentin [amoxicillin-pot clavulanate]   Review of Systems Review of Systems  All other systems reviewed and are negative.   Physical Exam Updated Vital Signs BP (!) 178/98 (BP Location: Right Arm)   Pulse (!) 106   Temp 100.1 F (37.8 C) (Oral)   Resp 17   SpO2 98%   Physical Exam  Constitutional: He is oriented to person, place, and time. He appears well-developed and well-nourished.  HENT:  Head: Normocephalic and atraumatic.  Bilateral tonsillar swelling, slightly enlarged on the left compared to right, does not cross midline, minor exudate noted, no pooling of secretions, no signs of PTA, voice normal-neck supple full active range of motion -bilateral TMs normal  Eyes: Pupils are equal, round, and reactive to light. Conjunctivae are normal. Right eye exhibits no discharge. Left eye exhibits no discharge. No scleral icterus.  Neck: Normal range of motion. No JVD present. No tracheal deviation present.  Pulmonary/Chest: Effort normal and breath sounds normal. No stridor. No respiratory distress. He has no wheezes. He has no rales. He exhibits no tenderness.  Musculoskeletal:  Vertical incision noted of the lumbar spine, clean intact no redness swelling, warmth, or discharge  Neurological: He is alert and oriented to person, place, and time. Coordination normal.  Psychiatric: He has a normal mood and affect.  His behavior is normal. Judgment and thought content normal.  Nursing note and vitals reviewed.    ED Treatments / Results  Labs (all labs ordered are listed, but only abnormal results are displayed) Labs Reviewed  GROUP A STREP BY PCR - Abnormal; Notable for the following components:      Result Value   Group A Strep by PCR DETECTED (*)    All other components within normal limits  COMPREHENSIVE METABOLIC PANEL - Abnormal; Notable  for the following components:   Glucose, Bld 111 (*)    All other components within normal limits  CBC WITH DIFFERENTIAL/PLATELET - Abnormal; Notable for the following components:   WBC 13.4 (*)    Neutro Abs 11.2 (*)    All other components within normal limits  URINALYSIS, ROUTINE W REFLEX MICROSCOPIC - Abnormal; Notable for the following components:   Hgb urine dipstick SMALL (*)    Leukocytes, UA TRACE (*)    Bacteria, UA RARE (*)    All other components within normal limits  I-STAT CG4 LACTIC ACID, ED  I-STAT CG4 LACTIC ACID, ED    EKG None  Radiology Dg Chest 2 View  Result Date: 03/28/2018 CLINICAL DATA:  Fever, throat pain EXAM: CHEST - 2 VIEW COMPARISON:  03/21/2018 FINDINGS: Heart and mediastinal contours are within normal limits. No focal opacities or effusions. No acute bony abnormality. IMPRESSION: No active cardiopulmonary disease. Electronically Signed   By: Charlett Nose M.D.   On: 03/28/2018 10:48    Procedures Procedures (including critical care time)  Medications Ordered in ED Medications  acetaminophen (TYLENOL) tablet 650 mg (650 mg Oral Given 03/28/18 1232)  dexamethasone (DECADRON) tablet 10 mg (10 mg Oral Given 03/28/18 1232)     Initial Impression / Assessment and Plan / ED Course  I have reviewed the triage vital signs and the nursing notes.  Pertinent labs & imaging results that were available during my care of the patient were reviewed by me and considered in my medical decision making (see chart for details).      Labs: Stat lactic acid, CMP, CBC and urinalysis, group A strep  Imaging: DG chest 2 view  Consults:  Therapeutics: decadron   Discharge Meds:   Assessment/Plan: Clinda   32 year old male presents today with strep pharyngitis.  Patient does have slightly enlarged tonsil on the left, this has been going on 1 day, question developing peritonsillar abscess, no significant swelling, tolerating p.o., no pooling of secretions.  No  indication for advanced imaging for immediate drainage.  Patient will be discharged home with clindamycin, Decadron, outpatient follow-up with ENT and strict return precautions.  Patient will return in 48 hours if he is unable to follow-up with ENT or if symptoms worsen.  Patient has no signs or symptoms consistent with infection status post surgery, he verbalized understanding and agreement to today's plan had no further questions or concerns at the time discharge.  Final Clinical Impressions(s) / ED Diagnoses   Final diagnoses:  Strep pharyngitis  Hypertension, unspecified type    ED Discharge Orders         Ordered    clindamycin (CLEOCIN) 150 MG capsule  3 times daily     03/28/18 1337             Eyvonne Mechanic, PA-C 03/28/18 1638    Tegeler, Canary Brim, MD 03/28/18 2246

## 2018-03-28 NOTE — Discharge Instructions (Addendum)
Please read attached information. If you experience any new or worsening signs or symptoms please return to the emergency room for evaluation. Please follow-up with your primary care provider or specialist as discussed. Please use medication prescribed only as directed and discontinue taking if you have any concerning signs or symptoms.   °

## 2018-08-16 ENCOUNTER — Inpatient Hospital Stay (HOSPITAL_BASED_OUTPATIENT_CLINIC_OR_DEPARTMENT_OTHER)
Admission: EM | Admit: 2018-08-16 | Discharge: 2018-08-25 | DRG: 194 | Discharge status: Against Medical Advice | Payer: Medicaid Other | Attending: Internal Medicine | Admitting: Internal Medicine

## 2018-08-16 ENCOUNTER — Encounter (HOSPITAL_BASED_OUTPATIENT_CLINIC_OR_DEPARTMENT_OTHER): Payer: Self-pay

## 2018-08-16 ENCOUNTER — Other Ambulatory Visit: Payer: Self-pay

## 2018-08-16 DIAGNOSIS — Z881 Allergy status to other antibiotic agents status: Secondary | ICD-10-CM

## 2018-08-16 DIAGNOSIS — Z79899 Other long term (current) drug therapy: Secondary | ICD-10-CM | POA: Diagnosis not present

## 2018-08-16 DIAGNOSIS — E669 Obesity, unspecified: Secondary | ICD-10-CM | POA: Diagnosis present

## 2018-08-16 DIAGNOSIS — J101 Influenza due to other identified influenza virus with other respiratory manifestations: Secondary | ICD-10-CM | POA: Diagnosis present

## 2018-08-16 DIAGNOSIS — Z5329 Procedure and treatment not carried out because of patient's decision for other reasons: Secondary | ICD-10-CM | POA: Diagnosis not present

## 2018-08-16 DIAGNOSIS — K5900 Constipation, unspecified: Secondary | ICD-10-CM | POA: Diagnosis not present

## 2018-08-16 DIAGNOSIS — I10 Essential (primary) hypertension: Secondary | ICD-10-CM | POA: Diagnosis present

## 2018-08-16 DIAGNOSIS — R739 Hyperglycemia, unspecified: Secondary | ICD-10-CM | POA: Diagnosis not present

## 2018-08-16 DIAGNOSIS — R809 Proteinuria, unspecified: Secondary | ICD-10-CM | POA: Diagnosis present

## 2018-08-16 DIAGNOSIS — Z6839 Body mass index (BMI) 39.0-39.9, adult: Secondary | ICD-10-CM | POA: Diagnosis not present

## 2018-08-16 DIAGNOSIS — R945 Abnormal results of liver function studies: Secondary | ICD-10-CM | POA: Diagnosis present

## 2018-08-16 DIAGNOSIS — M6282 Rhabdomyolysis: Secondary | ICD-10-CM | POA: Diagnosis present

## 2018-08-16 DIAGNOSIS — R7303 Prediabetes: Secondary | ICD-10-CM | POA: Diagnosis present

## 2018-08-16 DIAGNOSIS — F17210 Nicotine dependence, cigarettes, uncomplicated: Secondary | ICD-10-CM | POA: Diagnosis present

## 2018-08-16 DIAGNOSIS — E876 Hypokalemia: Secondary | ICD-10-CM | POA: Diagnosis present

## 2018-08-16 DIAGNOSIS — R52 Pain, unspecified: Secondary | ICD-10-CM | POA: Diagnosis present

## 2018-08-16 DIAGNOSIS — R7989 Other specified abnormal findings of blood chemistry: Secondary | ICD-10-CM

## 2018-08-16 LAB — CBC WITH DIFFERENTIAL/PLATELET
Abs Immature Granulocytes: 0.01 10*3/uL (ref 0.00–0.07)
BASOS ABS: 0 10*3/uL (ref 0.0–0.1)
BASOS PCT: 0 %
EOS PCT: 1 %
Eosinophils Absolute: 0.1 10*3/uL (ref 0.0–0.5)
HCT: 48.6 % (ref 39.0–52.0)
Hemoglobin: 16.1 g/dL (ref 13.0–17.0)
Immature Granulocytes: 0 %
Lymphocytes Relative: 37 %
Lymphs Abs: 1.8 10*3/uL (ref 0.7–4.0)
MCH: 27.4 pg (ref 26.0–34.0)
MCHC: 33.1 g/dL (ref 30.0–36.0)
MCV: 82.8 fL (ref 80.0–100.0)
MONO ABS: 0.3 10*3/uL (ref 0.1–1.0)
Monocytes Relative: 6 %
NRBC: 0 % (ref 0.0–0.2)
Neutro Abs: 2.7 10*3/uL (ref 1.7–7.7)
Neutrophils Relative %: 56 %
PLATELETS: 174 10*3/uL (ref 150–400)
RBC: 5.87 MIL/uL — AB (ref 4.22–5.81)
RDW: 12.5 % (ref 11.5–15.5)
WBC: 4.9 10*3/uL (ref 4.0–10.5)

## 2018-08-16 LAB — URINALYSIS, MICROSCOPIC (REFLEX)

## 2018-08-16 LAB — BASIC METABOLIC PANEL
Anion gap: 7 (ref 5–15)
BUN: 11 mg/dL (ref 6–20)
CHLORIDE: 101 mmol/L (ref 98–111)
CO2: 30 mmol/L (ref 22–32)
Calcium: 8.3 mg/dL — ABNORMAL LOW (ref 8.9–10.3)
Creatinine, Ser: 1.08 mg/dL (ref 0.61–1.24)
GFR calc Af Amer: >60 mL/min (ref >60)
GFR calc non Af Amer: >60 mL/min (ref >60)
Glucose, Bld: 97 mg/dL (ref 70–99)
POTASSIUM: 3.3 mmol/L — AB (ref 3.5–5.1)
SODIUM: 138 mmol/L (ref 135–145)

## 2018-08-16 LAB — HEPATIC FUNCTION PANEL
ALT: 77 U/L — ABNORMAL HIGH (ref 0–44)
AST: 688 U/L — AB (ref 15–41)
Albumin: 3.5 g/dL (ref 3.5–5.0)
Alkaline Phosphatase: 38 U/L (ref 38–126)
BILIRUBIN DIRECT: 0.1 mg/dL (ref 0.0–0.2)
BILIRUBIN INDIRECT: 0.4 mg/dL (ref 0.3–0.9)
Total Bilirubin: 0.5 mg/dL (ref 0.3–1.2)
Total Protein: 6.8 g/dL (ref 6.5–8.1)

## 2018-08-16 LAB — URINALYSIS, ROUTINE W REFLEX MICROSCOPIC
GLUCOSE, UA: NEGATIVE mg/dL
Ketones, ur: 15 mg/dL — AB
Nitrite: NEGATIVE
Specific Gravity, Urine: 1.025 (ref 1.005–1.030)
pH: 6.5 (ref 5.0–8.0)

## 2018-08-16 LAB — CK

## 2018-08-16 MED ORDER — SODIUM CHLORIDE 0.9 % IV BOLUS
1000.0000 mL | Freq: Once | INTRAVENOUS | Status: AC
  Administered 2018-08-16: 1000 mL via INTRAVENOUS

## 2018-08-16 MED ORDER — MORPHINE SULFATE (PF) 4 MG/ML IV SOLN
4.0000 mg | Freq: Once | INTRAVENOUS | Status: AC
  Administered 2018-08-16: 4 mg via INTRAVENOUS
  Filled 2018-08-16: qty 1

## 2018-08-16 MED ORDER — ONDANSETRON HCL 4 MG/2ML IJ SOLN
4.0000 mg | Freq: Once | INTRAMUSCULAR | Status: AC
  Administered 2018-08-16: 4 mg via INTRAVENOUS
  Filled 2018-08-16: qty 2

## 2018-08-16 MED ORDER — SODIUM CHLORIDE 0.9 % IV SOLN
INTRAVENOUS | Status: DC
  Administered 2018-08-17 – 2018-08-24 (×17): via INTRAVENOUS

## 2018-08-16 MED ORDER — ACETAMINOPHEN 325 MG PO TABS: 650.0000 mg | ORAL_TABLET | Freq: Four times a day (QID) | ORAL | Status: DC | PRN

## 2018-08-16 MED ORDER — ONDANSETRON HCL 4 MG/2ML IJ SOLN
4.0000 mg | Freq: Four times a day (QID) | INTRAMUSCULAR | Status: DC | PRN
  Administered 2018-08-17 – 2018-08-18 (×3): 4 mg via INTRAVENOUS
  Filled 2018-08-16 (×3): qty 2

## 2018-08-16 MED ORDER — ACETAMINOPHEN 650 MG RE SUPP: 650.0000 mg | Freq: Four times a day (QID) | RECTAL | Status: DC | PRN

## 2018-08-16 MED ORDER — ONDANSETRON HCL 4 MG PO TABS
4.0000 mg | ORAL_TABLET | Freq: Four times a day (QID) | ORAL | Status: DC | PRN
  Administered 2018-08-19 – 2018-08-20 (×2): 4 mg via ORAL
  Filled 2018-08-16 (×2): qty 1

## 2018-08-16 MED ORDER — SODIUM CHLORIDE 0.9 % IV BOLUS
1000.0000 mL | Freq: Once | INTRAVENOUS | Status: AC
  Administered 2018-08-17: 1000 mL via INTRAVENOUS

## 2018-08-16 MED ORDER — SODIUM CHLORIDE 0.9 % IV SOLN
INTRAVENOUS | Status: DC
  Administered 2018-08-16: 20:00:00 via INTRAVENOUS

## 2018-08-16 MED ORDER — HYDROCODONE-ACETAMINOPHEN 5-325 MG PO TABS
1.0000 | ORAL_TABLET | ORAL | Status: DC | PRN
  Administered 2018-08-17 (×2): 2 via ORAL
  Filled 2018-08-16 (×2): qty 2

## 2018-08-16 NOTE — ED Provider Notes (Signed)
[facility] EMERGENCY DEPARTMENT Provider Note   CSN: [id number] Arrival date & time: 08/16/18  1559     History   Chief Complaint Chief Complaint  Patient presents with  . Generalized Body Aches    HPI TIJUAN SAGEL is a 33 y.o. male who presents to ED for generalized body aches, myalgias since yesterday.  He notes having URI symptoms for the past 5 days but is more concerned about his myalgias.  States that he woke up yesterday "feeling like everything was heavy and I could not move."  He was seen at [facility] ED yesterday states that he was told he had influenza-like illness but "they did not really address the pain, I do not even have a cold or a fever."  He has not taken any medications to help with the symptoms.  He did donate plasma on 1/24 and 1/27.  Denies any injuries or falls.  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  Denies any urinary symptoms, shortness of breath, swelling. He denies chronic Tylenol use, alcohol intake. He does not take any daily medications.  HPI  Past Medical History:  Diagnosis Date  . Hypertension     There are no active problems to display for this patient.   Past Surgical History:  Procedure Laterality Date  . INCISE AND DRAIN ABCESS     in mouth, 2015  . LUMBAR LAMINECTOMY/DECOMPRESSION MICRODISCECTOMY Left 03/22/2018   Procedure: LEFT-SIDED LUMBAR 5-SACRUM 1 MICRODISECTOMY;  Surgeon: Estill Bamberg, MD;  Location: [facility] OR;  Service: Orthopedics;  Laterality: Left;        Home Medications    Prior to Admission medications   Medication Sig Start Date End Date Taking? Authorizing Provider  clindamycin (CLEOCIN) 150 MG capsule Take 3 capsules (450 mg total) by mouth 3 (three) times daily. 03/28/18   Eyvonne Mechanic, PA-C    Family History No family history on file.  Social History Social History   Tobacco Use  . Smoking status: Current Every Day Smoker    Packs/day: 0.50   Types: Cigarettes  . Smokeless tobacco: Never Used  Substance Use Topics  . Alcohol use: Never    Frequency: Never  . Drug use: Yes    Types: Marijuana     Allergies   Augmentin [amoxicillin-pot clavulanate]   Review of Systems Review of Systems  Constitutional: Negative for appetite change, chills and fever.  HENT: Negative for ear pain, rhinorrhea, sneezing and sore throat.   Eyes: Negative for photophobia and visual disturbance.  Respiratory: Negative for cough, chest tightness, shortness of breath and wheezing.   Cardiovascular: Negative for chest pain and palpitations.  Gastrointestinal: Negative for abdominal pain, blood in stool, constipation, diarrhea, nausea and vomiting.  Genitourinary: Negative for dysuria, hematuria and urgency.  Musculoskeletal: Positive for myalgias.  Skin: Negative for rash.  Neurological: Negative for dizziness, weakness and light-headedness.     Physical Exam Updated Vital Signs BP (!) 149/85 (BP Location: Right Arm)   Pulse 88   Temp 98.2 F (36.8 C) (Oral)   Resp (!) 22   Ht 6\' 1"  (1.854 m)   Wt 123.3 kg   SpO2 100%   BMI 35.86 kg/m   Physical Exam Vitals signs and nursing note reviewed.  Constitutional:      General: He is not in acute distress.    Appearance: He is well-developed.     Comments: Ambulatory.  HENT:     Head: Normocephalic  and atraumatic.     Nose: Nose normal.  Eyes:     General: No scleral icterus.       Left eye: No discharge.     Conjunctiva/sclera: Conjunctivae normal.  Neck:     Musculoskeletal: Normal range of motion and neck supple.  Cardiovascular:     Rate and Rhythm: Normal rate and regular rhythm.     Heart sounds: Normal heart sounds. No murmur. No friction rub. No gallop.   Pulmonary:     Effort: Pulmonary effort is normal. No respiratory distress.     Breath sounds: Normal breath sounds.  Abdominal:     General: Bowel sounds are normal. There is no distension.     Palpations: Abdomen is  soft.     Tenderness: There is no abdominal tenderness. There is no guarding.  Musculoskeletal: Normal range of motion.     Comments: Diffuse tenderness palpation of lower extremities, back.  Skin:    General: Skin is warm and dry.     Findings: No rash.  Neurological:     Mental Status: He is alert.     Motor: No abnormal muscle tone.     Coordination: Coordination normal.      ED Treatments / Results  Labs (all labs ordered are listed, but only abnormal results are displayed) Labs Reviewed  CBC WITH DIFFERENTIAL/PLATELET - Abnormal; Notable for the following components:      Result Value   RBC 5.87 (*)    All other components within normal limits  BASIC METABOLIC PANEL - Abnormal; Notable for the following components:   Potassium 3.3 (*)    Calcium 8.3 (*)    All other components within normal limits  CK - Abnormal; Notable for the following components:   Total CK >50,000 (*)    All other components within normal limits  URINALYSIS, ROUTINE W REFLEX MICROSCOPIC - Abnormal; Notable for the following components:   Color, Urine BROWN (*)    APPearance CLOUDY (*)    Hgb urine dipstick LARGE (*)    Bilirubin Urine SMALL (*)    Ketones, ur 15 (*)    Protein, ur >300 (*)    Leukocytes, UA TRACE (*)    All other components within normal limits  URINALYSIS, MICROSCOPIC (REFLEX) - Abnormal; Notable for the following components:   Bacteria, UA RARE (*)    All other components within normal limits  HEPATIC FUNCTION PANEL - Abnormal; Notable for the following components:   AST 688 (*)    ALT 77 (*)    All other components within normal limits    EKG None  Radiology No results found.  Procedures Procedures (including critical care time)  Medications Ordered in ED Medications  sodium chloride 0.9 % bolus 1,000 mL (has no administration in time range)  sodium chloride 0.9 % bolus 1,000 mL ( Intravenous Stopped 08/16/18 1851)  morphine 4 MG/ML injection 4 mg (4 mg  Intravenous Given 08/16/18 1905)     Initial Impression / Assessment and Plan / ED Course  I have reviewed the triage vital signs and the nursing notes.  Pertinent labs & imaging results that were available during my care of the patient were reviewed by me and considered in my medical decision making (see chart for details).     33 year old male presents to ED for generalized body aches since yesterday.  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  On my exam there is diffuse tenderness palpation of the back and lower extremities.  No overlying skin changes noted.  He is ambulatory here.  Lab work significant for CK greater than 50,000, hemoglobin and urine, AST of 688.  He denies any alcohol use, Tylenol use or other hepatotoxic medications. patient given 2 L of fluids and morphine here with improvement in his pain. Will be began on NS infusion. Will call hospitalist for admission.   Portions of this note were generated with Scientist, clinical (histocompatibility and immunogenetics)Dragon dictation software. Dictation errors may occur despite best attempts at proofreading.   Final Clinical Impressions(s) / ED Diagnoses   Final diagnoses:  Non-traumatic rhabdomyolysis    ED Discharge Orders    None       Dietrich PatesKhatri, Korbyn Chopin, PA-C 08/16/18 2006    Plunkett, Whitney, MD 08/16/18 2337

## 2018-08-16 NOTE — ED Notes (Signed)
[facility] notified Paul Rios) - patient is ready for transport

## 2018-08-16 NOTE — H&P (Signed)
History and Physical    Paul Rios ZOX:[id number] DOB: July 17, 1986 DOA: 08/16/2018  PCP: Patient, No Pcp Per  Patient coming from: Home  Chief Complaint: Body aches and pain  HPI: Paul SignsStephen L Rios is a 33 y.o. male with no medical history except mild hypertension who comes in with 1 to 2 days of pretty bad body aches and pain.  He went to urgent care yesterday he was told he had influenza although they did not do any labs or check him for the flu and sent him home on an albuterol inhaler.  He was wheezing.  He has no history of wheezing in the past.  He had been coughing some.  He denies any high fevers at home he denies any fevers or chills.  He denies any nausea vomiting diarrhea.  He denies any new medications.  He denies any recent traumatic injury or increase in physical activity.  He does not lift weights.  He denies any usage of any illegal drugs such as methamphetamine or cocaine.  He denies any rashes he denies any joint pains.  He has noticed that his urine is darker and more reddish in color today.  Patient has no history of rheumatological disorders in his past.  He denies any rashes.  Today's found to have a CPK level of over 50,000 undetectable with normal BUN and creatinine with mildly bumped LFTs.  He is referred for admission for rhabdomyolysis.  He is received 2 L of IV fluids and is still complaining of a lot of body aches.  Review of Systems: As per HPI otherwise 10 point review of systems negative.   Past Medical History:  Diagnosis Date  . Hypertension     Past Surgical History:  Procedure Laterality Date  . INCISE AND DRAIN ABCESS     in mouth, 2015  . LUMBAR LAMINECTOMY/DECOMPRESSION MICRODISCECTOMY Left 03/22/2018   Procedure: LEFT-SIDED LUMBAR 5-SACRUM 1 MICRODISECTOMY;  Surgeon: Estill Bambergumonski, Mark, MD;  Location: [facility] OR;  Service: Orthopedics;  Laterality: Left;     reports that he has been smoking cigarettes. He has been smoking about 0.50 packs per day. He has  never used smokeless tobacco. He reports current drug use. Drug: Marijuana. He reports that he does not drink alcohol.  Allergies  Allergen Reactions  . Augmentin [Amoxicillin-Pot Clavulanate] Other (See Comments)    Urinating blood, felt like insides were swelling, severe pain    History reviewed. No pertinent family history.  No premature coronary artery disease  Prior to Admission medications   Medication Sig Start Date End Date Taking? Authorizing Provider  clindamycin (CLEOCIN) 150 MG capsule Take 3 capsules (450 mg total) by mouth 3 (three) times daily. 03/28/18   Eyvonne MechanicHedges, Jeffrey, PA-C    Physical Exam: Vitals:   08/16/18 1856 08/16/18 2030 08/16/18 2100 08/16/18 2224  BP: (!) 149/85 (!) 153/89 (!) 146/79 (!) 153/93  Pulse: 88 62 65 (!) 58  Resp: (!) 22 20 14 19   Temp: 98.2 F (36.8 C)   98.1 F (36.7 C)  TempSrc: Oral   Oral  SpO2: 100% 98% 96% 97%  Weight:    131.5 kg  Height:    6\' 1"  (1.854 m)      Constitutional: NAD, calm, comfortable Vitals:   08/16/18 1856 08/16/18 2030 08/16/18 2100 08/16/18 2224  BP: (!) 149/85 (!) 153/89 (!) 146/79 (!) 153/93  Pulse: 88 62 65 (!) 58  Resp: (!) 22 20 14 19   Temp: 98.2 F (36.8 C)   98.1  F (36.7 C)  TempSrc: Oral   Oral  SpO2: 100% 98% 96% 97%  Weight:    131.5 kg  Height:    6\' 1"  (1.854 m)   Eyes: PERRL, lids and conjunctivae normal ENMT: Mucous membranes are moist. Posterior pharynx clear of any exudate or lesions.Normal dentition.  Neck: normal, supple, no masses, no thyromegaly Respiratory: clear to auscultation bilaterally, no wheezing, no crackles. Normal respiratory effort. No accessory muscle use.  Cardiovascular: Regular rate and rhythm, no murmurs / rubs / gallops. No extremity edema. 2+ pedal pulses. No carotid bruits.  Abdomen: no tenderness, no masses palpated. No hepatosplenomegaly. Bowel sounds positive.  Musculoskeletal: no clubbing / cyanosis. No joint deformity upper and lower extremities. Good  ROM, no contractures. Normal muscle tone.  Skin: no rashes, lesions, ulcers. No induration Neurologic: CN 2-12 grossly intact. Sensation intact, DTR normal. Strength 5/5 in all 4.  Psychiatric: Normal judgment and insight. Alert and oriented x 3. Normal mood.    Labs on Admission: I have personally reviewed following labs and imaging studies  CBC: Recent Labs  Lab 08/16/18 1657  WBC 4.9  NEUTROABS 2.7  HGB 16.1  HCT 48.6  MCV 82.8  PLT 174   Basic Metabolic Panel: Recent Labs  Lab 08/16/18 1657  NA 138  K 3.3*  CL 101  CO2 30  GLUCOSE 97  BUN 11  CREATININE 1.08  CALCIUM 8.3*   GFR: Estimated Creatinine Clearance: 139.6 mL/min (by C-G formula based on SCr of 1.08 mg/dL). Liver Function Tests: Recent Labs  Lab 08/16/18 1657  AST 688*  ALT 77*  ALKPHOS 38  BILITOT 0.5  PROT 6.8  ALBUMIN 3.5   No results for input(s): LIPASE, AMYLASE in the last 168 hours. No results for input(s): AMMONIA in the last 168 hours. Coagulation Profile: No results for input(s): INR, PROTIME in the last 168 hours. Cardiac Enzymes: Recent Labs  Lab 08/16/18 1657  CKTOTAL >50,000*   BNP (last 3 results) No results for input(s): PROBNP in the last 8760 hours. HbA1C: No results for input(s): HGBA1C in the last 72 hours. CBG: No results for input(s): GLUCAP in the last 168 hours. Lipid Profile: No results for input(s): CHOL, HDL, LDLCALC, TRIG, CHOLHDL, LDLDIRECT in the last 72 hours. Thyroid Function Tests: No results for input(s): TSH, T4TOTAL, FREET4, T3FREE, THYROIDAB in the last 72 hours. Anemia Panel: No results for input(s): VITAMINB12, FOLATE, FERRITIN, TIBC, IRON, RETICCTPCT in the last 72 hours. Urine analysis:    Component Value Date/Time   COLORURINE BROWN (A) 08/16/2018 1657   APPEARANCEUR CLOUDY (A) 08/16/2018 1657   LABSPEC 1.025 08/16/2018 1657   PHURINE 6.5 08/16/2018 1657   GLUCOSEU NEGATIVE 08/16/2018 1657   HGBUR LARGE (A) 08/16/2018 1657   BILIRUBINUR  SMALL (A) 08/16/2018 1657   KETONESUR 15 (A) 08/16/2018 1657   PROTEINUR >300 (A) 08/16/2018 1657   NITRITE NEGATIVE 08/16/2018 1657   LEUKOCYTESUR TRACE (A) 08/16/2018 1657   Sepsis Labs: !!!!!!!!!!!!!!!!!!!!!!!!!!!!!!!!!!!!!!!!!!!! @LABRCNTIP (procalcitonin:4,lacticidven:4) )No results found for this or any previous visit (from the past 240 hour(s)).   Radiological Exams on Admission: No results found.  Old chart reviewed Case discussed with Dr. Antionette Char  Assessment/Plan 33 year old healthy male with significant elevation of CPK with normal BUN and creatinine of unclear etiology Principal Problem:   Rhabdomyolysis-we will provide 2 more liters of IV fluids tonight provide aggressive IV fluid hydration overnight.  Check CPK level now and then every 8 hours.  A muscle can a check sed rate.  Check  HIV.  Check influenza panel.  Monitor renal function very closely and involve nephrology if needed.  Provide PRN pain medications as needed until IV fluids start to help.  Admit to telemetry.   DVT prophylaxis: Ambulate Code Status: Full Family Communication: None Disposition Plan: Days Consults called: None consider nephrology if renal involvement becomes evident Admission status: Admission   Dajai Wahlert A MD [facility]  If 7PM-7AM, please contact night-coverage [email or web address] Password [email or web address]  08/16/2018, 11:06 PM

## 2018-08-16 NOTE — ED Triage Notes (Addendum)
C/o body aches x 5 days-seen at [facility] ED yesterday-NAD-to triage in w/c-last tylenol at 11am-pt later added that he gave plasma on 1/24 and 1/27-weight was 195lb on 1/27 at plasma center

## 2018-08-17 ENCOUNTER — Other Ambulatory Visit (HOSPITAL_COMMUNITY): Payer: Self-pay

## 2018-08-17 ENCOUNTER — Inpatient Hospital Stay (HOSPITAL_COMMUNITY): Payer: Medicaid Other

## 2018-08-17 DIAGNOSIS — R809 Proteinuria, unspecified: Secondary | ICD-10-CM

## 2018-08-17 DIAGNOSIS — J101 Influenza due to other identified influenza virus with other respiratory manifestations: Principal | ICD-10-CM

## 2018-08-17 DIAGNOSIS — E669 Obesity, unspecified: Secondary | ICD-10-CM

## 2018-08-17 DIAGNOSIS — E876 Hypokalemia: Secondary | ICD-10-CM

## 2018-08-17 LAB — CBC WITH DIFFERENTIAL/PLATELET
Abs Immature Granulocytes: 0.01 10*3/uL (ref 0.00–0.07)
BASOS ABS: 0 10*3/uL (ref 0.0–0.1)
Basophils Relative: 0 %
Eosinophils Absolute: 0.1 10*3/uL (ref 0.0–0.5)
Eosinophils Relative: 2 %
HEMATOCRIT: 42.7 % (ref 39.0–52.0)
Hemoglobin: 14.5 g/dL (ref 13.0–17.0)
Immature Granulocytes: 0 %
Lymphocytes Relative: 40 %
Lymphs Abs: 1.9 10*3/uL (ref 0.7–4.0)
MCH: 28 pg (ref 26.0–34.0)
MCHC: 34 g/dL (ref 30.0–36.0)
MCV: 82.6 fL (ref 80.0–100.0)
Monocytes Absolute: 0.2 10*3/uL (ref 0.1–1.0)
Monocytes Relative: 5 %
NRBC: 0 % (ref 0.0–0.2)
Neutro Abs: 2.5 10*3/uL (ref 1.7–7.7)
Neutrophils Relative %: 53 %
Platelets: 161 10*3/uL (ref 150–400)
RBC: 5.17 MIL/uL (ref 4.22–5.81)
RDW: 12.8 % (ref 11.5–15.5)
WBC: 4.7 10*3/uL (ref 4.0–10.5)

## 2018-08-17 LAB — COMPREHENSIVE METABOLIC PANEL
ALT: 87 U/L — ABNORMAL HIGH (ref 0–44)
ALT: 77 U/L — AB (ref 0–44)
AST: 694 U/L — ABNORMAL HIGH (ref 15–41)
AST: 825 U/L — ABNORMAL HIGH (ref 15–41)
Albumin: 2.8 g/dL — ABNORMAL LOW (ref 3.5–5.0)
Albumin: 2.6 g/dL — ABNORMAL LOW (ref 3.5–5.0)
Alkaline Phosphatase: 27 U/L — ABNORMAL LOW (ref 38–126)
Alkaline Phosphatase: 24 U/L — ABNORMAL LOW (ref 38–126)
Anion gap: 6 (ref 5–15)
Anion gap: 5 (ref 5–15)
BUN: 6 mg/dL (ref 6–20)
BUN: 5 mg/dL — ABNORMAL LOW (ref 6–20)
CO2: 30 mmol/L (ref 22–32)
CO2: 27 mmol/L (ref 22–32)
CREATININE: 1.04 mg/dL (ref 0.61–1.24)
Calcium: 7.5 mg/dL — ABNORMAL LOW (ref 8.9–10.3)
Calcium: 7.5 mg/dL — ABNORMAL LOW (ref 8.9–10.3)
Chloride: 105 mmol/L (ref 98–111)
Chloride: 108 mmol/L (ref 98–111)
Creatinine, Ser: 1.03 mg/dL (ref 0.61–1.24)
GFR calc Af Amer: >60 mL/min (ref >60)
GFR calc Af Amer: >60 mL/min (ref >60)
GFR calc non Af Amer: >60 mL/min (ref >60)
GFR calc non Af Amer: >60 mL/min (ref >60)
Glucose, Bld: 90 mg/dL (ref 70–99)
Glucose, Bld: 94 mg/dL (ref 70–99)
Potassium: 3.5 mmol/L (ref 3.5–5.1)
Potassium: 3.5 mmol/L (ref 3.5–5.1)
Sodium: 141 mmol/L (ref 135–145)
Sodium: 140 mmol/L (ref 135–145)
TOTAL PROTEIN: 5.2 g/dL — AB (ref 6.5–8.1)
Total Bilirubin: 0.5 mg/dL (ref 0.3–1.2)
Total Bilirubin: 0.5 mg/dL (ref 0.3–1.2)
Total Protein: 5.4 g/dL — ABNORMAL LOW (ref 6.5–8.1)

## 2018-08-17 LAB — URINALYSIS, ROUTINE W REFLEX MICROSCOPIC
Bacteria, UA: NONE SEEN
Bilirubin Urine: NEGATIVE
Glucose, UA: NEGATIVE mg/dL
Ketones, ur: NEGATIVE mg/dL
Leukocytes, UA: NEGATIVE
Nitrite: NEGATIVE
Protein, ur: 100 mg/dL — AB
Specific Gravity, Urine: 1.011 (ref 1.005–1.030)
pH: 7 (ref 5.0–8.0)

## 2018-08-17 LAB — RAPID URINE DRUG SCREEN, HOSP PERFORMED
AMPHETAMINES: NOT DETECTED
BARBITURATES: NOT DETECTED
BENZODIAZEPINES: NOT DETECTED
COCAINE: NOT DETECTED
Opiates: POSITIVE — AB
Tetrahydrocannabinol: POSITIVE — AB

## 2018-08-17 LAB — PHOSPHORUS: Phosphorus: 2.9 mg/dL (ref 2.5–4.6)

## 2018-08-17 LAB — SEDIMENTATION RATE: Sed Rate: 2 mm/hr (ref 0–16)

## 2018-08-17 LAB — CBC
HCT: 43.3 % (ref 39.0–52.0)
Hemoglobin: 14.3 g/dL (ref 13.0–17.0)
MCH: 27.4 pg (ref 26.0–34.0)
MCHC: 33 g/dL (ref 30.0–36.0)
MCV: 83.1 fL (ref 80.0–100.0)
Platelets: 156 10*3/uL (ref 150–400)
RBC: 5.21 MIL/uL (ref 4.22–5.81)
RDW: 12.8 % (ref 11.5–15.5)
WBC: 4.7 10*3/uL (ref 4.0–10.5)
nRBC: 0 % (ref 0.0–0.2)

## 2018-08-17 LAB — INFLUENZA PANEL BY PCR (TYPE A & B)
Influenza A By PCR: NEGATIVE
Influenza B By PCR: POSITIVE — AB

## 2018-08-17 LAB — CK TOTAL AND CKMB (NOT AT ARMC)
CK, MB: 44.8 ng/mL — ABNORMAL HIGH (ref 0.5–5.0)
CK, MB: 48.5 ng/mL — ABNORMAL HIGH (ref 0.5–5.0)
CK, MB: 52.1 ng/mL — ABNORMAL HIGH (ref 0.5–5.0)
CK, MB: 48 ng/mL — ABNORMAL HIGH (ref 0.5–5.0)
Total CK: >50000 U/L — ABNORMAL HIGH (ref 49–397)
Total CK: >50000 U/L — ABNORMAL HIGH (ref 49–397)
Total CK: >50000 U/L — ABNORMAL HIGH (ref 49–397)
Total CK: >50000 U/L — ABNORMAL HIGH (ref 49–397)

## 2018-08-17 LAB — MAGNESIUM: Magnesium: 2.1 mg/dL (ref 1.7–2.4)

## 2018-08-17 LAB — HIV ANTIBODY (ROUTINE TESTING W REFLEX): HIV Screen 4th Generation wRfx: NONREACTIVE

## 2018-08-17 MED ORDER — OSELTAMIVIR PHOSPHATE 75 MG PO CAPS
75.0000 mg | ORAL_CAPSULE | Freq: Two times a day (BID) | ORAL | Status: AC
  Administered 2018-08-17 – 2018-08-21 (×10): 75 mg via ORAL
  Filled 2018-08-17 (×10): qty 1

## 2018-08-17 MED ORDER — OXYCODONE HCL 5 MG PO TABS
5.0000 mg | ORAL_TABLET | ORAL | Status: DC | PRN
  Administered 2018-08-17 – 2018-08-22 (×15): 5 mg via ORAL
  Filled 2018-08-17 (×15): qty 1

## 2018-08-17 MED ORDER — LIP MEDEX EX OINT
TOPICAL_OINTMENT | CUTANEOUS | Status: DC | PRN
  Filled 2018-08-17: qty 7

## 2018-08-17 MED ORDER — POTASSIUM CHLORIDE CRYS ER 20 MEQ PO TBCR
40.0000 meq | EXTENDED_RELEASE_TABLET | Freq: Once | ORAL | Status: AC
  Administered 2018-08-17: 40 meq via ORAL
  Filled 2018-08-17: qty 2

## 2018-08-17 MED FILL — Morphine Sulfate Inj 4 MG/ML: INTRAMUSCULAR | Qty: 1 | Status: AC

## 2018-08-17 NOTE — Care Management (Signed)
Spoke w outpatient pharmacy, 5 days of Tamiflu would cost $109.65 there, as patient has no insurance.

## 2018-08-17 NOTE — Progress Notes (Signed)
Patient enetered into [facility] system. Spoke w Marlou Sa PharmD in [facility] pharmacy. They will follow for DC meds, verified TOC has Tamiflu in stock.  PLEASE SEND DC MEDS THROUGH [facility] Friday 08/18/2018.  Thank you

## 2018-08-17 NOTE — Progress Notes (Signed)
PROGRESS NOTE    Paul Rios  ZOX:[id number] DOB: [birth year] DOA: 08/16/2018 PCP: Patient, No Pcp Per   Brief Narrative:  HPI per [name] on 08/16/2018 Paul Rios is a 33 y.o. male with no medical history except mild hypertension who comes in with 1 to 2 days of pretty bad body aches and pain.  He went to urgent care yesterday he was told he had influenza although they did not do any labs or check him for the flu and sent him home on an albuterol inhaler.  He was wheezing.  He has no history of wheezing in the past.  He had been coughing some.  He denies any high fevers at home he denies any fevers or chills.  He denies any nausea vomiting diarrhea.  He denies any new medications.  He denies any recent traumatic injury or increase in physical activity.  He does not lift weights.  He denies any usage of any illegal drugs such as methamphetamine or cocaine.  He denies any rashes he denies any joint pains.  He has noticed that his urine is darker and more reddish in color today.  Patient has no history of rheumatological disorders in his past.  He denies any rashes.  Today's found to have a CPK level of over 50,000 undetectable with normal BUN and creatinine with mildly bumped LFTs.  He is referred for admission for rhabdomyolysis.  He is received 2 L of IV fluids and is still complaining of a lot of body aches.  **Still receiving IVF Hydration and complaining of bodyaches. CK's remaining elevated.   Assessment & Plan:   Principal Problem:   Rhabdomyolysis Active Problems:   Proteinuria   Influenza B   Hypokalemia   Obesity (BMI 30-39.9)  Acute Rhabdomyolysis -Unclear Etiology; No Hx of Seizures, Strenuous Exercise, Prolonged Immobility; Has been taking OTC Tylenol Cold and Flu and Unbranded Nyquil -Stop all medications with Acetaminophen in it -Urinalysis showed cloudy appearance with small bilirubin, brown color urine, large hemoglobin on the urine dipstick, 15 ketones  trace leukocytes, greater than 300 protein, rare bacteria and RBCs per high-power field were 6-10 -CK has been >50,000 x3 so far and CK-MB elevated and went from 44.8 -> 48.5; will continue to cycle CK and CK-MBs every 8 hours Given 4 Liters of NS and started Maintenance IVF at 150 mL/hr and will continue for now -ESR was 2 -continue with pain control and have discontinued his hydrocodone acetaminophen will place on oxycodone 5 mg every 4 PRN for moderate pain; currently no indication for IV pain medication -Strict I's and O's -Continue to monitor and repeat CKs and follow trend -Currently has no Renal Involvement   Proteinuria -Had greater than 300 protein on his urinalysis -We will repeat -Does not have volume overload currently  Influenza B -Still positive for influenza B via PCR -Started Tamiflu 75 mg p.o. twice daily x5 days -Continue with supportive care with Antiemetics with po/IV Zofran q6hprn -Have discontinued his antipyretics with acetaminophen given abnormal LFTs -Continue with fluid hydration as above  Hypokalemia -Was mild on admission at 3.3 -Was now replete and is now 3.5 -We will give 40 mEq of potassium chloride today -Continue to monitor replete as necessary -Repeat CMP in the a.m.  Abnormal LFTs -In setting of rhabdomyolysis but rule out other etiologies -AST went from 688 on admission is now 825 -ALT went from 77 is now 87 -Alk phos is low and T bili is normal -We will  check right upper quadrant ultrasound and acute hepatitis panel -Continue to monitor and trend hepatic function panel -Repeat CMP in a.m.  Obesity -Estimated body mass index is 38.25 kg/m as calculated from the following:   Height as of this encounter: '6\' 1"'$  (1.854 m).   Weight as of this encounter: 131.5 kg. -Weight Loss Counseling given   DVT prophylaxis: SCDs Code Status: FULL CODE Family Communication: No family present at bedside Disposition Plan: Remain inpatient for continued  fluid hydration and resolution of rhabdomyolysis  Consultants:   None   Procedures: RUQ U/S  Antimicrobials:  Anti-infectives (From admission, onward)   Start     Dose/Rate Route Frequency Ordered Stop   08/17/18 1000  oseltamivir (TAMIFLU) capsule 75 mg     75 mg Oral 2 times daily 08/17/18 0818 08/22/18 0959     Subjective: Seen and examined at bedside still feeling bad and still felt very weak and fatigued.  No nausea or vomiting.  No chest pain, lightheadedness or dizziness.  Had not noticed any bruising states that he is felt bad since Sunday.  Objective: Vitals:   08/16/18 2100 08/16/18 2224 08/17/18 0507 08/17/18 0838  BP: (!) 146/79 (!) 153/93 120/80 (!) 151/88  Pulse: 65 (!) 58 (!) 52 60  Resp: '14 19 19 18  '$ Temp:  98.1 F (36.7 C) 97.6 F (36.4 C) 97.6 F (36.4 C)  TempSrc:  Oral Oral   SpO2: 96% 97% 100% 97%  Weight:  131.5 kg    Height:  '6\' 1"'$  (1.854 m)      Intake/Output Summary (Last 24 hours) at 08/17/2018 1227 Last data filed at 08/17/2018 0849 Gross per 24 hour  Intake 6492.56 ml  Output 1450 ml  Net 5042.56 ml   Filed Weights   08/16/18 1610 08/16/18 2224  Weight: 123.3 kg 131.5 kg   Examination: Physical Exam:  Constitutional: WN/WD obese AAM in NAD and appears calm but slightly uncomfortable Eyes: Lids and conjunctivae normal, sclerae anicteric  ENMT: External Ears, Nose appear normal. Grossly normal hearing.  Neck: Appears normal, supple, no cervical masses, normal ROM, no appreciable thyromegaly; no JVD Respiratory: Diminished to auscultation bilaterally, no wheezing, rales, rhonchi or crackles. Normal respiratory effort and patient is not tachypenic. No accessory muscle use.  Cardiovascular: RRR, no murmurs / rubs / gallops. S1 and S2 auscultated. No extremity edema.  Abdomen: Soft, non-tender, Distended 2/2 body habitus. No masses palpated. No appreciable hepatosplenomegaly. Bowel sounds positive x4.  GU: Deferred. Musculoskeletal: No  clubbing / cyanosis of digits/nails. No joint deformity upper and lower extremities.   Skin: No rashes, lesions, ulcers and have not noticed bruising on a limited skin evaluation. No induration; Warm and dry.  Neurologic: CN 2-12 grossly intact with no focal deficits. Romberg sign and cerebellar reflexes not assessed.  Psychiatric: Normal judgment and insight. Alert and oriented x 3. Normal mood and appropriate affect.   Data Reviewed: I have personally reviewed following labs and imaging studies  CBC: Recent Labs  Lab 08/16/18 1657 08/16/18 2358 08/17/18 0917  WBC 4.9 4.7 4.7  NEUTROABS 2.7  --  2.5  HGB 16.1 14.3 14.5  HCT 48.6 43.3 42.7  MCV 82.8 83.1 82.6  PLT 174 156 754   Basic Metabolic Panel: Recent Labs  Lab 08/16/18 1657 08/16/18 2358 08/17/18 0917  NA 138 141 140  K 3.3* 3.5 3.5  CL 101 105 108  CO2 '30 30 27  '$ GLUCOSE 97 90 94  BUN 11 6 5*  CREATININE 1.08  1.04 1.03  CALCIUM 8.3* 7.5* 7.5*  MG  --   --  2.1  PHOS  --   --  2.9   GFR: Estimated Creatinine Clearance: 146.4 mL/min (by C-G formula based on SCr of 1.03 mg/dL). Liver Function Tests: Recent Labs  Lab 08/16/18 1657 08/16/18 2358 08/17/18 0917  AST 688* 694* 825*  ALT 77* 77* 87*  ALKPHOS 38 27* 24*  BILITOT 0.5 0.5 0.5  PROT 6.8 5.4* 5.2*  ALBUMIN 3.5 2.8* 2.6*   No results for input(s): LIPASE, AMYLASE in the last 168 hours. No results for input(s): AMMONIA in the last 168 hours. Coagulation Profile: No results for input(s): INR, PROTIME in the last 168 hours. Cardiac Enzymes: Recent Labs  Lab 08/16/18 1657 08/16/18 2358 08/17/18 0659  CKTOTAL >50,000* >50,000* >50,000*  CKMB  --  44.8* 48.5*   BNP (last 3 results) No results for input(s): PROBNP in the last 8760 hours. HbA1C: No results for input(s): HGBA1C in the last 72 hours. CBG: No results for input(s): GLUCAP in the last 168 hours. Lipid Profile: No results for input(s): CHOL, HDL, LDLCALC, TRIG, CHOLHDL, LDLDIRECT in  the last 72 hours. Thyroid Function Tests: No results for input(s): TSH, T4TOTAL, FREET4, T3FREE, THYROIDAB in the last 72 hours. Anemia Panel: No results for input(s): VITAMINB12, FOLATE, FERRITIN, TIBC, IRON, RETICCTPCT in the last 72 hours. Sepsis Labs: No results for input(s): PROCALCITON, LATICACIDVEN in the last 168 hours.  No results found for this or any previous visit (from the past 240 hour(s)).   Radiology Studies: No results found.  Scheduled Meds: . oseltamivir  75 mg Oral BID  . potassium chloride  40 mEq Oral Once   Continuous Infusions: . sodium chloride 150 mL/hr at 08/17/18 0131    LOS: 1 day   Kerney Elbe, DO [facility] PAGER is on [place]  If 7PM-7AM, please contact night-coverage [email or web address] Password [facility] 08/17/2018, 12:27 PM

## 2018-08-18 DIAGNOSIS — K5900 Constipation, unspecified: Secondary | ICD-10-CM

## 2018-08-18 LAB — COMPREHENSIVE METABOLIC PANEL
ALT: 107 U/L — ABNORMAL HIGH (ref 0–44)
AST: 911 U/L — ABNORMAL HIGH (ref 15–41)
Albumin: 2.8 g/dL — ABNORMAL LOW (ref 3.5–5.0)
Alkaline Phosphatase: 28 U/L — ABNORMAL LOW (ref 38–126)
Anion gap: 8 (ref 5–15)
BUN: 7 mg/dL (ref 6–20)
CO2: 27 mmol/L (ref 22–32)
Calcium: 8 mg/dL — ABNORMAL LOW (ref 8.9–10.3)
Chloride: 106 mmol/L (ref 98–111)
Creatinine, Ser: 0.94 mg/dL (ref 0.61–1.24)
GFR calc Af Amer: >60 mL/min (ref >60)
GFR calc non Af Amer: >60 mL/min (ref >60)
Glucose, Bld: 104 mg/dL — ABNORMAL HIGH (ref 70–99)
Potassium: 4.2 mmol/L (ref 3.5–5.1)
SODIUM: 141 mmol/L (ref 135–145)
Total Bilirubin: 0.6 mg/dL (ref 0.3–1.2)
Total Protein: 5.4 g/dL — ABNORMAL LOW (ref 6.5–8.1)

## 2018-08-18 LAB — CK TOTAL AND CKMB (NOT AT ARMC)
CK, MB: 35.1 ng/mL — ABNORMAL HIGH (ref 0.5–5.0)
CK, MB: 35.6 ng/mL — ABNORMAL HIGH (ref 0.5–5.0)
Total CK: >50000 U/L — ABNORMAL HIGH (ref 49–397)

## 2018-08-18 LAB — CBC WITH DIFFERENTIAL/PLATELET
Abs Immature Granulocytes: 0 10*3/uL (ref 0.00–0.07)
Basophils Absolute: 0 10*3/uL (ref 0.0–0.1)
Basophils Relative: 0 %
Eosinophils Absolute: 0 10*3/uL (ref 0.0–0.5)
Eosinophils Relative: 0 %
HCT: 43.7 % (ref 39.0–52.0)
HEMOGLOBIN: 14.6 g/dL (ref 13.0–17.0)
Lymphocytes Relative: 29 %
Lymphs Abs: 1.7 10*3/uL (ref 0.7–4.0)
MCH: 27.9 pg (ref 26.0–34.0)
MCHC: 33.4 g/dL (ref 30.0–36.0)
MCV: 83.4 fL (ref 80.0–100.0)
Monocytes Absolute: 0.2 10*3/uL (ref 0.1–1.0)
Monocytes Relative: 4 %
Neutro Abs: 4 10*3/uL (ref 1.7–7.7)
Neutrophils Relative %: 67 %
Platelets: 187 10*3/uL (ref 150–400)
RBC: 5.24 MIL/uL (ref 4.22–5.81)
RDW: 12.7 % (ref 11.5–15.5)
WBC: 6 10*3/uL (ref 4.0–10.5)
nRBC: 0 % (ref 0.0–0.2)
nRBC: 0 /100 WBC

## 2018-08-18 LAB — PHOSPHORUS: Phosphorus: 2 mg/dL — ABNORMAL LOW (ref 2.5–4.6)

## 2018-08-18 LAB — HEPATITIS PANEL, ACUTE
HCV Ab: <0.1 s/co ratio (ref 0.0–0.9)
Hep A IgM: NEGATIVE
Hep B C IgM: NEGATIVE
Hepatitis B Surface Ag: NEGATIVE

## 2018-08-18 LAB — MAGNESIUM: Magnesium: 2.1 mg/dL (ref 1.7–2.4)

## 2018-08-18 MED ORDER — POLYETHYLENE GLYCOL 3350 17 G PO PACK
17.0000 g | PACK | Freq: Two times a day (BID) | ORAL | Status: DC
  Administered 2018-08-18: 17 g via ORAL
  Filled 2018-08-18 (×8): qty 1

## 2018-08-18 MED ORDER — SENNOSIDES-DOCUSATE SODIUM 8.6-50 MG PO TABS
1.0000 | ORAL_TABLET | Freq: Two times a day (BID) | ORAL | Status: DC
  Administered 2018-08-18: 1 via ORAL
  Filled 2018-08-18 (×8): qty 1

## 2018-08-18 MED ORDER — BISACODYL 10 MG RE SUPP: 10.0000 mg | Freq: Every day | RECTAL | Status: DC | PRN

## 2018-08-18 MED ORDER — K PHOS MONO-SOD PHOS DI & MONO 155-852-130 MG PO TABS
500.0000 mg | ORAL_TABLET | Freq: Two times a day (BID) | ORAL | Status: AC
  Administered 2018-08-18 (×2): 500 mg via ORAL
  Filled 2018-08-18 (×2): qty 2

## 2018-08-18 MED FILL — Ondansetron HCl Inj 4 MG/2ML (2 MG/ML): INTRAMUSCULAR | Qty: 2 | Status: AC

## 2018-08-18 NOTE — Progress Notes (Addendum)
PROGRESS NOTE    Paul Rios  NGE:[id number] DOB: December 19, 1985 DOA: 08/16/2018 PCP: Patient, No Pcp Per   Brief Narrative:  HPI per [name] on 08/16/2018 Paul Rios is a 33 y.o. male with no medical history except mild hypertension who comes in with 1 to 2 days of pretty bad body aches and pain.  He went to urgent care yesterday he was told he had influenza although they did not do any labs or check him for the flu and sent him home on an albuterol inhaler.  He was wheezing.  He has no history of wheezing in the past.  He had been coughing some.  He denies any high fevers at home he denies any fevers or chills.  He denies any nausea vomiting diarrhea.  He denies any new medications.  He denies any recent traumatic injury or increase in physical activity.  He does not lift weights.  He denies any usage of any illegal drugs such as methamphetamine or cocaine.  He denies any rashes he denies any joint pains.  He has noticed that his urine is darker and more reddish in color today.  Patient has no history of rheumatological disorders in his past.  He denies any rashes.  Today's found to have a CPK level of over 50,000 undetectable with normal BUN and creatinine with mildly bumped LFTs.  He is referred for admission for rhabdomyolysis.  He is received 2 L of IV fluids and is still complaining of a lot of body aches.  **Still receiving IVF Hydration and complaining of bodyaches. CK's remaining elevated still and LFTs slightly worsening.  Assessment & Plan:   Principal Problem:   Rhabdomyolysis Active Problems:   Proteinuria   Influenza B   Hypokalemia   Obesity (BMI 30-39.9)  Acute Rhabdomyolysis -Unclear Etiology; No Hx of Seizures, Strenuous Exercise, Prolonged Immobility; Has been taking OTC Tylenol Cold and Flu and Unbranded Nyquil -Stop all medications with Acetaminophen in it -Urinalysis showed cloudy appearance with small bilirubin, brown color urine, large hemoglobin on  the urine dipstick, 15 ketones trace leukocytes, greater than 300 protein, rare bacteria and RBCs per high-power field were 6-10 -CK has been >50,000 x6 so far and CK-MB elevated and went from 44.8 -> 52.1 and peaked and now trending down to 35.1; will continue to cycle CK and CK-MBs every 8 hours Given 4 Liters of NS and started Maintenance IVF at 150 mL/hr and will continue for now -ESR was 2 -Continue with pain control and have discontinued his Hydrocodone-Acetaminophen will place on oxycodone 5 mg every 4 PRN for moderate pain; currently no indication for IV pain medication -Strict I's and O's -Will Ambulate the Patient  -Continue to monitor and repeat CKs and follow trend -Currently has no Renal Involvement but if does will need to consult Nephrology   Proteinuria -Had greater than 300 protein on his initial urinalysis -Repeat U/A showed 100 Protein  -Does not have volume overload currently  Influenza B -Still positive for influenza B via PCR -Started Tamiflu 75 mg p.o. twice daily x5 days -Continue with supportive care with Antiemetics with po/IV Zofran q6hprn -Have discontinued his antipyretics with Acetaminophen given abnormal LFTs -Continue with fluid hydration as above  Hypokalemia -Was mild on admission at 3.3 and now improved to 4.2 today -Continue to monitor replete as necessary -Repeat CMP in the a.m.  Hypophosphatemia -Patient's Phos Level this AM was 2.0 -Replete with K Phos Neutral 500 mg po BID x2 Doses -Continue  to Monitor and Replete as Necessary -Repeat Phos Level in AM   Abnormal LFTs, worsening -In setting of rhabdomyolysis but rule out other etiologies -AST went from 688 on admission is now 911 -ALT went from 77 is now 107 -Alk phos is low and T bili is normal -Acute Hepatitis Panel Negative -RUQ U/S showed Increased heterogeneous echotexture in the liver without a focal mass is nonspecific but may represent hepatic steatosis. No other  abnormalities. -Continue to monitor and trend hepatic function panel -Repeat CMP in a.m.  Obesity -Estimated body mass index is 38.25 kg/m as calculated from the following:   Height as of this encounter: _0  (1.854 m).   Weight as of this encounter: 131.5 kg. -Weight Loss Counseling given   Constipation -Started the patient on Miralax 17 grams po BID, Senna-Docusate 1 tab po BID and Bisacodyl 10 mg RC Dailyprn  DVT prophylaxis: SCDs; Ambulation  Code Status: FULL CODE Family Communication: No family present at bedside Disposition Plan: Remain inpatient for continued fluid hydration and resolution of rhabdomyolysis  Consultants:   None   Procedures: RUQ U/S  Antimicrobials:  Anti-infectives (From admission, onward)   Start     Dose/Rate Route Frequency Ordered Stop   08/17/18 1000  oseltamivir (TAMIFLU) capsule 75 mg     75 mg Oral 2 times daily 08/17/18 0818 08/22/18 0959     Subjective: Seen and examined at bedside and still complaining of bodyaches. Frustrated about being Hospitalized. No nausea or vomiting. No lightheadedness or dizziness. Complaining of pain in arms. Also states has not had a bowel movement in a while.  Objective: Vitals:   08/17/18 1714 08/17/18 2107 08/18/18 0557 08/18/18 0928  BP: (!) 149/93 (!) 146/81 (!) 148/87 (!) 147/89  Pulse: (!) 59 61 (!) 57 (!) 59  Resp: _1 (!) 21  Temp:  98 F (36.7 C) 99.4 F (37.4 C) 98.6 F (37 C)  TempSrc:  Oral Oral Oral  SpO2: 98% 98% 97% 99%  Weight:      Height:        Intake/Output Summary (Last 24 hours) at 08/18/2018 1303 Last data filed at 08/18/2018 1107 Gross per 24 hour  Intake 4046.9 ml  Output 3325 ml  Net 721.9 ml   Filed Weights   08/16/18 1610 08/16/18 2224  Weight: 123.3 kg 131.5 kg   Examination: Physical Exam:  Constitutional: Well-nourished, well-developed obese African-American male currently no acute distress appears calm but does appear slightly uncomfortable still and  is agitated Eyes: Lids and conjunctive are normal.  Sclera anicteric ENMT: External ears and nose appear normal.  Grossly normal hearing Neck: Appears supple no JVD Respiratory: Diminished to auscultation bilaterally no appreciable wheezing, rales, rhonchi.  Patient not tachypneic wheezing and accessory muscles to breathe Cardiovascular: Regular rate and rhythm.  No appreciable murmurs, rubs, gallops.  Has slight 1+ lower extremity edema Abdomen: Soft, nontender, distended secondary body habitus.  Bowel sounds present GU: Deferred Musculoskeletal: No contractures or cyanosis.  No joint deformities noted Skin: Skin is warm and dry no appreciable rashes or lesions limited skin evaluation but does have some tattoos noted no evidence of any bruising Neurologic: Cranial nerves II through XII grossly intact no appreciable focal deficits Psychiatric: Slightly agitated and had a frustrated mood but normal insight and judgment.  Patient is awake alert and oriented x3  Data Reviewed: I have personally reviewed following labs and imaging studies  CBC: Recent Labs  Lab 08/16/18 1657 08/16/18 2358 08/17/18 0917 08/18/18  0643  WBC 4.9 4.7 4.7 6.0  NEUTROABS 2.7  --  2.5 4.0  HGB 16.1 14.3 14.5 14.6  HCT 48.6 43.3 42.7 43.7  MCV 82.8 83.1 82.6 83.4  PLT 174 156 161 829   Basic Metabolic Panel: Recent Labs  Lab 08/16/18 1657 08/16/18 2358 08/17/18 0917 08/18/18 0643  NA 138 141 140 141  K 3.3* 3.5 3.5 4.2  CL 101 105 108 106  CO2 _0 GLUCOSE 97 90 94 104*  BUN 11 6 5* 7  CREATININE 1.08 1.04 1.03 0.94  CALCIUM 8.3* 7.5* 7.5* 8.0*  MG  --   --  2.1 2.1  PHOS  --   --  2.9 2.0*   GFR: Estimated Creatinine Clearance: 160.4 mL/min (by C-G formula based on SCr of 0.94 mg/dL). Liver Function Tests: Recent Labs  Lab 08/16/18 1657 08/16/18 2358 08/17/18 0917 08/18/18 0643  AST 688* 694* 825* 911*  ALT 77* 77* 87* 107*  ALKPHOS 38 27* 24* 28*  BILITOT 0.5 0.5 0.5 0.6  PROT  6.8 5.4* 5.2* 5.4*  ALBUMIN 3.5 2.8* 2.6* 2.8*   No results for input(s): LIPASE, AMYLASE in the last 168 hours. No results for input(s): AMMONIA in the last 168 hours. Coagulation Profile: No results for input(s): INR, PROTIME in the last 168 hours. Cardiac Enzymes: Recent Labs  Lab 08/16/18 2358 08/17/18 0659 08/17/18 1323 08/17/18 2211 08/18/18 0643  CKTOTAL >50,000* >50,000* >50,000* >50,000* >50,000*  CKMB 44.8* 48.5* 52.1* 48.0* 35.1*   BNP (last 3 results) No results for input(s): PROBNP in the last 8760 hours. HbA1C: No results for input(s): HGBA1C in the last 72 hours. CBG: No results for input(s): GLUCAP in the last 168 hours. Lipid Profile: No results for input(s): CHOL, HDL, LDLCALC, TRIG, CHOLHDL, LDLDIRECT in the last 72 hours. Thyroid Function Tests: No results for input(s): TSH, T4TOTAL, FREET4, T3FREE, THYROIDAB in the last 72 hours. Anemia Panel: No results for input(s): VITAMINB12, FOLATE, FERRITIN, TIBC, IRON, RETICCTPCT in the last 72 hours. Sepsis Labs: No results for input(s): PROCALCITON, LATICACIDVEN in the last 168 hours.  No results found for this or any previous visit (from the past 240 hour(s)).   Radiology Studies: US Abdomen Limited Ruq  Result Date: 08/17/2018 CLINICAL DATA:  Abnormal LFTs EXAM: ULTRASOUND ABDOMEN LIMITED RIGHT UPPER QUADRANT COMPARISON:  None. FINDINGS: Gallbladder: No gallstones or wall thickening visualized. No sonographic Murphy sign noted by sonographer. Common bile duct: Diameter: 3.3 mm Liver: Heterogeneous increased echotexture with no focal mass. Portal vein is patent on color Doppler imaging with normal direction of blood flow towards the liver. IMPRESSION: Increased heterogeneous echotexture in the liver without a focal mass is nonspecific but may represent hepatic steatosis. No other abnormalities. Electronically Signed   By: Dorise Bullion III M.D   On: 08/17/2018 18:33    Scheduled Meds: . oseltamivir  75 mg  Oral BID  . phosphorus  500 mg Oral BID  . polyethylene glycol  17 g Oral BID  . senna-docusate  1 tablet Oral BID   Continuous Infusions: . sodium chloride 150 mL/hr at 08/18/18 0736    LOS: 2 days   Kerney Elbe, DO [facility] PAGER is on AMION  If 7PM-7AM, please contact night-coverage [email or web address] Password [email or web address] 08/18/2018, 1:03 PM

## 2018-08-19 LAB — CBC WITH DIFFERENTIAL/PLATELET
Abs Immature Granulocytes: 0.03 10*3/uL (ref 0.00–0.07)
BASOS PCT: 0 %
Basophils Absolute: 0 10*3/uL (ref 0.0–0.1)
Eosinophils Absolute: 0.2 10*3/uL (ref 0.0–0.5)
Eosinophils Relative: 2 %
HCT: 41.7 % (ref 39.0–52.0)
Hemoglobin: 14.2 g/dL (ref 13.0–17.0)
Immature Granulocytes: 1 %
Lymphocytes Relative: 31 %
Lymphs Abs: 2 10*3/uL (ref 0.7–4.0)
MCH: 28.2 pg (ref 26.0–34.0)
MCHC: 34.1 g/dL (ref 30.0–36.0)
MCV: 82.7 fL (ref 80.0–100.0)
Monocytes Absolute: 0.3 10*3/uL (ref 0.1–1.0)
Monocytes Relative: 5 %
Neutro Abs: 3.9 10*3/uL (ref 1.7–7.7)
Neutrophils Relative %: 61 %
PLATELETS: 201 10*3/uL (ref 150–400)
RBC: 5.04 MIL/uL (ref 4.22–5.81)
RDW: 12.4 % (ref 11.5–15.5)
WBC: 6.4 10*3/uL (ref 4.0–10.5)
nRBC: 0 % (ref 0.0–0.2)

## 2018-08-19 LAB — CK TOTAL AND CKMB (NOT AT ARMC)
CK, MB: 32.2 ng/mL — ABNORMAL HIGH (ref 0.5–5.0)
CK, MB: 32.1 ng/mL — ABNORMAL HIGH (ref 0.5–5.0)
CK, MB: 31 ng/mL — ABNORMAL HIGH (ref 0.5–5.0)
Total CK: >50000 U/L — ABNORMAL HIGH (ref 49–397)
Total CK: >50000 U/L — ABNORMAL HIGH (ref 49–397)
Total CK: >50000 U/L — ABNORMAL HIGH (ref 49–397)

## 2018-08-19 LAB — COMPREHENSIVE METABOLIC PANEL
ALT: 131 U/L — ABNORMAL HIGH (ref 0–44)
AST: 1095 U/L — ABNORMAL HIGH (ref 15–41)
Albumin: 2.6 g/dL — ABNORMAL LOW (ref 3.5–5.0)
Alkaline Phosphatase: 28 U/L — ABNORMAL LOW (ref 38–126)
Anion gap: 7 (ref 5–15)
BUN: 7 mg/dL (ref 6–20)
CO2: 28 mmol/L (ref 22–32)
Calcium: 8.2 mg/dL — ABNORMAL LOW (ref 8.9–10.3)
Chloride: 105 mmol/L (ref 98–111)
Creatinine, Ser: 1.02 mg/dL (ref 0.61–1.24)
GFR calc Af Amer: >60 mL/min (ref >60)
GFR calc non Af Amer: >60 mL/min (ref >60)
Glucose, Bld: 92 mg/dL (ref 70–99)
Potassium: 4.5 mmol/L (ref 3.5–5.1)
Sodium: 140 mmol/L (ref 135–145)
Total Bilirubin: 0.6 mg/dL (ref 0.3–1.2)
Total Protein: 5.4 g/dL — ABNORMAL LOW (ref 6.5–8.1)

## 2018-08-19 LAB — PROTIME-INR
INR: 0.96
Prothrombin Time: 12.6 seconds (ref 11.4–15.2)

## 2018-08-19 LAB — PHOSPHORUS: PHOSPHORUS: 2.8 mg/dL (ref 2.5–4.6)

## 2018-08-19 LAB — MAGNESIUM: Magnesium: 2.1 mg/dL (ref 1.7–2.4)

## 2018-08-19 LAB — GAMMA GT: GGT: 23 U/L (ref 7–50)

## 2018-08-19 MED ORDER — SODIUM BICARBONATE 8.4 % IV SOLN
INTRAVENOUS | Status: DC
  Administered 2018-08-19 – 2018-08-23 (×6): via INTRAVENOUS
  Filled 2018-08-19 (×9): qty 100

## 2018-08-19 NOTE — Progress Notes (Signed)
PROGRESS NOTE    Paul Rios  VOH:[id number] DOB: 1986/01/16 DOA: 08/16/2018 PCP: Patient, No Pcp Per   Brief Narrative:  HPI per [name] on 08/16/2018 Paul Rios is a 33 y.o. male with no medical history except mild hypertension who comes in with 1 to 2 days of pretty bad body aches and pain.  He went to urgent care yesterday he was told he had influenza although they did not do any labs or check him for the flu and sent him home on an albuterol inhaler.  He was wheezing.  He has no history of wheezing in the past.  He had been coughing some.  He denies any high fevers at home he denies any fevers or chills.  He denies any nausea vomiting diarrhea.  He denies any new medications.  He denies any recent traumatic injury or increase in physical activity.  He does not lift weights.  He denies any usage of any illegal drugs such as methamphetamine or cocaine.  He denies any rashes he denies any joint pains.  He has noticed that his urine is darker and more reddish in color today.  Patient has no history of rheumatological disorders in his past.  He denies any rashes.  Today's found to have a CPK level of over 50,000 undetectable with normal BUN and creatinine with mildly bumped LFTs.  He is referred for admission for rhabdomyolysis.  He is received 2 L of IV fluids and is still complaining of a lot of body aches.  **Still receiving IVF Hydration and complaining of bodyaches. CK's remaining elevated still and LFTs slightly worsening.  Case was discussed with both the nephrologist as well as the gastroenterologist who believes that this is all from his rhabdo from the flu.  They recommend continuing IV fluid hydration.  Dr. [name] recommended obtain a GGT which was normal as well as an INR which was also normal.  Dr. [name] of nephrology recommended adding a little bit of bicarbonate to patient's fluid.  Continue aggressive hydration at this time.  Assessment & Plan:   Principal  Problem:   Rhabdomyolysis Active Problems:   Proteinuria   Influenza B   Hypokalemia   Obesity (BMI 30-39.9)  Acute Rhabdomyolysis 2/2 to Influenza B -Unclear Etiology; No Hx of Seizures, Strenuous Exercise, Prolonged Immobility; Has been taking OTC Tylenol Cold and Flu and Unbranded Nyquil -Stop all medications with Acetaminophen in it -Urinalysis showed cloudy appearance with small bilirubin, brown color urine, large hemoglobin on the urine dipstick, 15 ketones trace leukocytes, greater than 300 protein, rare bacteria and RBCs per high-power field were 6-10 -CK has been >50,000 x9 so far and CK-MB elevated and went from 44.8 -> 52.1 and peaked and now trending down to 32.1; will continue to cycle CK and CK-MBs every 8 hours Given 4 Liters of NS and started Maintenance IVF at 150 mL/hr; -Maintenance IVF changed to 100 mL/hr and added 100 mEQ of Sodium Bicarbonate to D5W and will be running that at 50 mL/hr to supplement  -ESR was 2 -Continue with pain control and have discontinued his Hydrocodone-Acetaminophen will place on oxycodone 5 mg every 4 PRN for moderate pain; currently no indication for IV pain medication -Strict I's and O's; Patient is +4.854 Liters since Admission -Will Ambulate the Patient but patient felt Lethargic -Continue to monitor and repeat CKs and follow trend -Discussed with Nephrology and they recommend if his GFR starts to drop, to call them for formal consultation  Proteinuria -  Had greater than 300 protein on his initial urinalysis -Repeat U/A showed 100 Protein  -C/w IVF Hydration  Influenza B -Still positive for influenza B via PCR -Started Tamiflu 75 mg p.o. twice daily x5 days and will continue course -Continue with supportive care with Antiemetics with po/IV Zofran q6hprn -Have discontinued his antipyretics with Acetaminophen given abnormal LFTs -Continue with fluid hydration as above  Hypokalemia -Was mild on admission at 3.3 and now improved to 4.5  today -Continue to monitor replete as necessary -Repeat CMP in the a.m.  Hypophosphatemia -Patient's Phos Level this AM was 2.8 -Continue to Monitor and Replete as Necessary -Repeat Phos Level in AM   Abnormal LFTs, worsening -In setting of rhabdomyolysis but rule out other etiologies -AST went from 688 on admission is now 1,095 -ALT went from 77 is now 131 -Alk phos is low and T bili is normal; GGT also normal at 23 -PT-INR checked and was 12.6 and 0.96 -Acute Hepatitis Panel Negative -RUQ U/S showed Increased heterogeneous echotexture in the liver without a focal mass is nonspecific but may represent hepatic steatosis. No other abnormalities. -Continue to monitor and trend hepatic function panel -Discussed Case with Dr. [name] of Gastroenterology who feels this is all muscular cause of Abnormal LFT's and feels that there is no cholestatic injury. He recommends continuing IVF and expects LFT's to improve once Rhabdomyolysis improves -Repeat CMP in AM   Obesity -Estimated body mass index is 39.21 kg/m as calculated from the following:   Height as of this encounter: '6\' 1"'$  (1.854 m).   Weight as of this encounter: 134.8 kg. -Weight Loss Counseling given   Constipation -Started the patient on Miralax 17 grams po BID, Senna-Docusate 1 tab po BID and Bisacodyl 10 mg RC Dailyprn  DVT prophylaxis: SCDs; Ambulation  Code Status: FULL CODE Family Communication: No family present at bedside Disposition Plan: Remain inpatient for continued fluid hydration and resolution of rhabdomyolysis  Consultants:   Discussed case with Nephrology Dr. [name]  Discussed case with Gastroenterology Dr. [name]    Procedures: RUQ U/S  Antimicrobials:  Anti-infectives (From admission, onward)   Start     Dose/Rate Route Frequency Ordered Stop   08/17/18 1000  oseltamivir (TAMIFLU) capsule 75 mg     75 mg Oral 2 times daily 08/17/18 0818 08/22/18 0959     Subjective: Seen and examined at  bedside and complaining of significant body aches specifically in his forearms.  Felt very fatigued and was unable to ambulate yesterday due to his weakness and fatigue.  Able to have a bowel movement though.  Appeared a little frustrated but understand that he needs to continue fluid hydration today in order to get better.  No nausea or vomiting.  No other concerns or complaints at this time.  Objective: Vitals:   08/18/18 1655 08/18/18 2029 08/19/18 0512 08/19/18 0853  BP: (!) 159/93 (!) 159/104 (!) 141/80 (!) 142/89  Pulse: 66 70 (!) 46 (!) 52  Resp: '20  18 18  '$ Temp:  98.8 F (37.1 C) 98.2 F (36.8 C) 98.8 F (37.1 C)  TempSrc:  Oral Oral Oral  SpO2: 98% 96% 99% 99%  Weight:  134.8 kg    Height:        Intake/Output Summary (Last 24 hours) at 08/19/2018 1220 Last data filed at 08/19/2018 0854 Gross per 24 hour  Intake 2640 ml  Output 2650 ml  Net -10 ml   Filed Weights   08/16/18 1610 08/16/18 2224 08/18/18 2029  Weight:  123.3 kg 131.5 kg 134.8 kg   Examination: Physical Exam:  Constitutional: Well-nourished, well-developed obese African-American male currently no acute distress appears calm but does appear slightly frustrated.  Still appears somewhat uncomfortable laying in bed Eyes: Lids and conjunctive are normal.  Sclera anicteric ENMT: External ears and nose appear normal.  Grossly normal hearing Neck: Appears supple with no JVD Respiratory: Slight diminished auscultation bilaterally no appreciable wheezing, rales, rhonchi.  Patient was not tachypneic or using any accessory muscles to breathe Cardiovascular: Regular rate and rhythm.  No appreciable murmurs, rubs, gallops.  Trace lower extremity edema Abdomen: Soft, nontender, distended secondary body habitus.  Bowel sounds present GU: Deferred Musculoskeletal: Contractures or cyanosis.  No joint deformities noted Skin: Skin is warm and dry no appreciable rashes or lesions limited skin evaluation.  Has no noticeable  bruising but does have some skin tattoos noted Neurologic: Cranial nerves II through XII grossly intact no appreciable focal deficits Psychiatric: Normal mood and affect.  Appears slightly frustrated.  He is awake, alert, and oriented x3  Data Reviewed: I have personally reviewed following labs and imaging studies  CBC: Recent Labs  Lab 08/16/18 1657 08/16/18 2358 08/17/18 0917 08/18/18 0643 08/19/18 0540  WBC 4.9 4.7 4.7 6.0 6.4  NEUTROABS 2.7  --  2.5 4.0 3.9  HGB 16.1 14.3 14.5 14.6 14.2  HCT 48.6 43.3 42.7 43.7 41.7  MCV 82.8 83.1 82.6 83.4 82.7  PLT 174 156 161 187 202   Basic Metabolic Panel: Recent Labs  Lab 08/16/18 1657 08/16/18 2358 08/17/18 0917 08/18/18 0643 08/19/18 0540  NA 138 141 140 141 140  K 3.3* 3.5 3.5 4.2 4.5  CL 101 105 108 106 105  CO2 '30 30 27 27 28  '$ GLUCOSE 97 90 94 104* 92  BUN 11 6 5* 7 7  CREATININE 1.08 1.04 1.03 0.94 1.02  CALCIUM 8.3* 7.5* 7.5* 8.0* 8.2*  MG  --   --  2.1 2.1 2.1  PHOS  --   --  2.9 2.0* 2.8   GFR: Estimated Creatinine Clearance: 149.9 mL/min (by C-G formula based on SCr of 1.02 mg/dL). Liver Function Tests: Recent Labs  Lab 08/16/18 1657 08/16/18 2358 08/17/18 0917 08/18/18 0643 08/19/18 0540  AST 688* 694* 825* 911* 1,095*  ALT 77* 77* 87* 107* 131*  ALKPHOS 38 27* 24* 28* 28*  BILITOT 0.5 0.5 0.5 0.6 0.6  PROT 6.8 5.4* 5.2* 5.4* 5.4*  ALBUMIN 3.5 2.8* 2.6* 2.8* 2.6*   No results for input(s): LIPASE, AMYLASE in the last 168 hours. No results for input(s): AMMONIA in the last 168 hours. Coagulation Profile: Recent Labs  Lab 08/19/18 0924  INR 0.96   Cardiac Enzymes: Recent Labs  Lab 08/17/18 2211 08/18/18 0643 08/18/18 1544 08/18/18 2305 08/19/18 0540  CKTOTAL >50,000* >50,000* >50,000* >50,000* >50,000*  CKMB 48.0* 35.1* 35.6* 32.2* 32.1*   BNP (last 3 results) No results for input(s): PROBNP in the last 8760 hours. HbA1C: No results for input(s): HGBA1C in the last 72 hours. CBG: No  results for input(s): GLUCAP in the last 168 hours. Lipid Profile: No results for input(s): CHOL, HDL, LDLCALC, TRIG, CHOLHDL, LDLDIRECT in the last 72 hours. Thyroid Function Tests: No results for input(s): TSH, T4TOTAL, FREET4, T3FREE, THYROIDAB in the last 72 hours. Anemia Panel: No results for input(s): VITAMINB12, FOLATE, FERRITIN, TIBC, IRON, RETICCTPCT in the last 72 hours. Sepsis Labs: No results for input(s): PROCALCITON, LATICACIDVEN in the last 168 hours.  No results found for this or  any previous visit (from the past 240 hour(s)).   Radiology Studies: US Abdomen Limited Ruq  Result Date: 08/17/2018 CLINICAL DATA:  Abnormal LFTs EXAM: ULTRASOUND ABDOMEN LIMITED RIGHT UPPER QUADRANT COMPARISON:  None. FINDINGS: Gallbladder: No gallstones or wall thickening visualized. No sonographic Murphy sign noted by sonographer. Common bile duct: Diameter: 3.3 mm Liver: Heterogeneous increased echotexture with no focal mass. Portal vein is patent on color Doppler imaging with normal direction of blood flow towards the liver. IMPRESSION: Increased heterogeneous echotexture in the liver without a focal mass is nonspecific but may represent hepatic steatosis. No other abnormalities. Electronically Signed   By: Dorise Bullion III M.D   On: 08/17/2018 18:33    Scheduled Meds: . oseltamivir  75 mg Oral BID  . polyethylene glycol  17 g Oral BID  . senna-docusate  1 tablet Oral BID   Continuous Infusions: . sodium chloride 100 mL/hr at 08/19/18 1001  .  sodium bicarbonate  infusion 1000 mL 50 mL/hr at 08/19/18 1008    LOS: 3 days   Kerney Elbe, DO [facility] PAGER is on AMION  If 7PM-7AM, please contact night-coverage [email or web address] Password [email or web address] 08/19/2018, 12:20 PM

## 2018-08-20 DIAGNOSIS — R739 Hyperglycemia, unspecified: Secondary | ICD-10-CM

## 2018-08-20 LAB — CK TOTAL AND CKMB (NOT AT ARMC)
CK, MB: 26.9 ng/mL — ABNORMAL HIGH (ref 0.5–5.0)
CK, MB: 20.5 ng/mL — ABNORMAL HIGH (ref 0.5–5.0)
CK, MB: 24.9 ng/mL — ABNORMAL HIGH (ref 0.5–5.0)
Total CK: >50000 U/L — ABNORMAL HIGH (ref 49–397)
Total CK: >50000 U/L — ABNORMAL HIGH (ref 49–397)
Total CK: >50000 U/L — ABNORMAL HIGH (ref 49–397)

## 2018-08-20 LAB — COMPREHENSIVE METABOLIC PANEL
ALT: 144 U/L — AB (ref 0–44)
AST: 1010 U/L — AB (ref 15–41)
Albumin: 2.6 g/dL — ABNORMAL LOW (ref 3.5–5.0)
Alkaline Phosphatase: 29 U/L — ABNORMAL LOW (ref 38–126)
Anion gap: 7 (ref 5–15)
BUN: 8 mg/dL (ref 6–20)
CO2: 29 mmol/L (ref 22–32)
CREATININE: 0.97 mg/dL (ref 0.61–1.24)
Calcium: 8.3 mg/dL — ABNORMAL LOW (ref 8.9–10.3)
Chloride: 104 mmol/L (ref 98–111)
GFR calc Af Amer: >60 mL/min (ref >60)
GFR calc non Af Amer: >60 mL/min (ref >60)
GLUCOSE: 108 mg/dL — AB (ref 70–99)
Potassium: 4.6 mmol/L (ref 3.5–5.1)
Sodium: 140 mmol/L (ref 135–145)
Total Bilirubin: 0.3 mg/dL (ref 0.3–1.2)
Total Protein: 5.5 g/dL — ABNORMAL LOW (ref 6.5–8.1)

## 2018-08-20 LAB — CBC WITH DIFFERENTIAL/PLATELET
ABS IMMATURE GRANULOCYTES: 0.04 10*3/uL (ref 0.00–0.07)
Basophils Absolute: 0 10*3/uL (ref 0.0–0.1)
Basophils Relative: 0 %
Eosinophils Absolute: 0.2 10*3/uL (ref 0.0–0.5)
Eosinophils Relative: 3 %
HCT: 41.3 % (ref 39.0–52.0)
Hemoglobin: 14 g/dL (ref 13.0–17.0)
Immature Granulocytes: 1 %
Lymphocytes Relative: 28 %
Lymphs Abs: 2.1 10*3/uL (ref 0.7–4.0)
MCH: 28.2 pg (ref 26.0–34.0)
MCHC: 33.9 g/dL (ref 30.0–36.0)
MCV: 83.1 fL (ref 80.0–100.0)
Monocytes Absolute: 0.4 10*3/uL (ref 0.1–1.0)
Monocytes Relative: 5 %
NEUTROS ABS: 4.8 10*3/uL (ref 1.7–7.7)
Neutrophils Relative %: 63 %
Platelets: 229 10*3/uL (ref 150–400)
RBC: 4.97 MIL/uL (ref 4.22–5.81)
RDW: 12.5 % (ref 11.5–15.5)
WBC: 7.6 10*3/uL (ref 4.0–10.5)
nRBC: 0.3 % — ABNORMAL HIGH (ref 0.0–0.2)

## 2018-08-20 LAB — PHOSPHORUS: Phosphorus: 3.1 mg/dL (ref 2.5–4.6)

## 2018-08-20 LAB — MAGNESIUM: Magnesium: 2.1 mg/dL (ref 1.7–2.4)

## 2018-08-20 NOTE — Progress Notes (Signed)
PROGRESS NOTE    Paul Rios  [name]:[id number] DOB: 03-07-1986 DOA: 08/16/2018 PCP: Patient, No Pcp Per   Brief Narrative:  HPI per [name] on 08/16/2018 Paul Rios is a 33 y.o. male with no medical history except mild hypertension who comes in with 1 to 2 days of pretty bad body aches and pain.  He went to urgent care yesterday he was told he had influenza although they did not do any labs or check him for the flu and sent him home on an albuterol inhaler.  He was wheezing.  He has no history of wheezing in the past.  He had been coughing some.  He denies any high fevers at home he denies any fevers or chills.  He denies any nausea vomiting diarrhea.  He denies any new medications.  He denies any recent traumatic injury or increase in physical activity.  He does not lift weights.  He denies any usage of any illegal drugs such as methamphetamine or cocaine.  He denies any rashes he denies any joint pains.  He has noticed that his urine is darker and more reddish in color today.  Patient has no history of rheumatological disorders in his past.  He denies any rashes.  Today's found to have a CPK level of over 50,000 undetectable with normal BUN and creatinine with mildly bumped LFTs.  He is referred for admission for rhabdomyolysis.  He is received 2 L of IV fluids and is still complaining of a lot of body aches.  **Still receiving IVF Hydration and complaining of bodyaches. CK's remaining elevated still and LFTs slightly worsening.  Case was discussed with both the nephrologist as well as the gastroenterologist who believes that this is all from his rhabdo from the flu.  They recommend continuing IV fluid hydration.  Dr. [name] recommended obtain a GGT which was normal as well as an INR which was also normal.  Dr. [name] of nephrology recommended adding a little bit of bicarbonate to patient's fluid.  Continue aggressive hydration at this time as CKMB is improving along with  AST.  Assessment & Plan:   Principal Problem:   Rhabdomyolysis Active Problems:   Proteinuria   Influenza B   Hypokalemia   Obesity (BMI 30-39.9)  Acute Rhabdomyolysis 2/2 to Influenza B -Unclear Etiology; No Hx of Seizures, Strenuous Exercise, Prolonged Immobility; Has been taking OTC Tylenol Cold and Flu and Unbranded Nyquil -Stop all medications with Acetaminophen in it -Urinalysis showed cloudy appearance with small bilirubin, brown color urine, large hemoglobin on the urine dipstick, 15 ketones trace leukocytes, greater than 300 protein, rare bacteria and RBCs per high-power field were 6-10 -CK has been >50,000 x12 so far and CK-MB elevated and went from 44.8 -> 52.1 and peaked and now trending down to 20.5; will continue to cycle CK and CK-MBs every 8 hours Given 4 Liters of NS and started Maintenance IVF at 150 mL/hr; -Maintenance IVF changed to 100 mL/hr and added 100 mEQ of Sodium Bicarbonate to D5W and will be running that at 50 mL/hr to supplement  -ESR was 2 -Continue with pain control and have discontinued his Hydrocodone-Acetaminophen will place on oxycodone 5 mg every 4 PRN for moderate pain; currently no indication for IV pain medication -Strict I's and O's; Patient is +6.699 Liters since Admission -Will Ambulate the Patient but patient felt Lethargic -Continue to monitor and repeat CKs and follow trend -Discussed with Nephrology and they recommend if his GFR starts to drop, to  call them for formal consultation  Proteinuria -Had greater than 300 protein on his initial urinalysis -Repeat U/A showed 100 Protein  -C/w IVF Hydration as above  Influenza B -Still positive for influenza B via PCR -Started Tamiflu 75 mg p.o. twice daily x5 days and will continue course until completion  -Continue with supportive care with Antiemetics with po/IV Zofran q6hprn -Have discontinued his antipyretics with Acetaminophen given abnormal LFTs -Continue with fluid hydration as  above  Hypokalemia -Was mild on admission at 3.3 and now improved to 4.6 today -Continue to monitor replete as necessary -Repeat CMP in the a.m.  Hypophosphatemia -Patient's Phos Level this AM was 3.1 -Continue to Monitor and Replete as Necessary -Repeat Phos Level in AM   Abnormal LFTs, now appearing stable -In setting of rhabdomyolysis but rule out other etiologies -AST went from 688 on admission is now 1010 and down from 1,095 -ALT went from 77 is now 144  -Alk phos is low and T bili is normal; GGT also normal at 23 -PT-INR checked and was 12.6 and 0.96 -Acute Hepatitis Panel Negative -RUQ U/S showed Increased heterogeneous echotexture in the liver without a focal mass is nonspecific but may represent hepatic steatosis. No other abnormalities. -Continue to monitor and trend hepatic function panel -Discussed Case with Dr. [name] of Gastroenterology who feels this is all muscular cause of Abnormal LFT's and feels that there is no cholestatic injury. He recommends continuing IVF and expects LFT's to improve once Rhabdomyolysis improves -Repeat CMP in AM   Obesity -Estimated body mass index is 39.21 kg/m as calculated from the following:   Height as of this encounter: '6\' 1"'$  (1.854 m).   Weight as of this encounter: 134.8 kg. -Weight Loss Counseling given   Constipation -Started the patient on Miralax 17 grams po BID, Senna-Docusate 1 tab po BID and Bisacodyl 10 mg RC Dailyprn  Hyperglycemia -Likely reactive but will check HbA1c -Ranging from 92-108 on Daily CMP's/BMPs -Continue to Monitor Blood Sugars carefully and if remaining elevated place on Sensitive Novolog SSI  DVT prophylaxis: SCDs; Ambulation  Code Status: FULL CODE Family Communication: Discussed with family present at bedside Disposition Plan: Remain inpatient for continued fluid hydration and resolution of rhabdomyolysis  Consultants:   Discussed case with Nephrology Dr. [name]  Discussed case with  Gastroenterology Dr. [name]    Procedures: RUQ U/S  Antimicrobials:  Anti-infectives (From admission, onward)   Start     Dose/Rate Route Frequency Ordered Stop   08/17/18 1000  oseltamivir (TAMIFLU) capsule 75 mg     75 mg Oral 2 times daily 08/17/18 0818 08/22/18 0959     Subjective: Seen and examined at bedside and still complaining of soreness and states that he had a rough night and finally got some sleep for a while.  States that he is extremely sore in the back of his legs and has but whenever he gets up to ambulate and sit down on the toilet.  No nausea or vomiting.  Discussed with him that his EKG trending downwards and that his LFTs look stable and he is happy to hear that.  CK still remains severely elevated and understand that he needs to be hydrated still.  No other concerns or complaints at this time  Objective: Vitals:   08/19/18 1802 08/19/18 2052 08/20/18 0322 08/20/18 0953  BP: (!) 161/89 (!) 176/99 132/71 (!) 144/94  Pulse: 65 81 61 72  Resp: '20 18  19  '$ Temp: 99.1 F (37.3 C) 98.9 F (37.2  C) 98.3 F (36.8 C) 98.3 F (36.8 C)  TempSrc: Oral Oral Oral Oral  SpO2: 96% 97% 97% 99%  Weight:  134.8 kg    Height:        Intake/Output Summary (Last 24 hours) at 08/20/2018 1142 Last data filed at 08/20/2018 1004 Gross per 24 hour  Intake 3645 ml  Output 1800 ml  Net 1845 ml   Filed Weights   08/16/18 2224 08/18/18 2029 08/19/18 2052  Weight: 131.5 kg 134.8 kg 134.8 kg   Examination: Physical Exam:  Constitutional: Well-nourished, well-developed obese African-American male currently no acute distress appears calm laying in bed but does very little uncomfortable and still appears frustrated. Eyes: Lids and conjunctive are normal.  Sclera anicteric ENMT: External ears and nose appear normal.  Grossly normal hearing Neck: Appears supple no JVD Respiratory: Diminished auscultation bilaterally no appreciable wheezing, rales, rhonchi.  Patient not tachypneic wheezing  or using any accessory muscles to breathe Cardiovascular: Regular rate and rhythm.  No appreciable murmurs, rubs, gallops.  Has trace lower extremity edema Abdomen: Soft, nontender, distended second body habitus.  Bowel sounds present GU: Deferred Musculoskeletal: No contractures or cyanosis.  No joint deformities noted Skin: Skin is warm and dry no appreciable rashes or lesions on skin evaluation.  No noticeable bruising noted but does have some skin tattoos Neurologic: Cranial nerves II through XII gross intact no appreciable focal deficits Psychiatric: Slightly agitated mood but normal affect.  Appears still frustrated.  He is awake, alert and oriented x3  Data Reviewed: I have personally reviewed following labs and imaging studies  CBC: Recent Labs  Lab 08/16/18 1657 08/16/18 2358 08/17/18 0917 08/18/18 0643 08/19/18 0540 08/20/18 0558  WBC 4.9 4.7 4.7 6.0 6.4 7.6  NEUTROABS 2.7  --  2.5 4.0 3.9 4.8  HGB 16.1 14.3 14.5 14.6 14.2 14.0  HCT 48.6 43.3 42.7 43.7 41.7 41.3  MCV 82.8 83.1 82.6 83.4 82.7 83.1  PLT 174 156 161 187 201 161   Basic Metabolic Panel: Recent Labs  Lab 08/16/18 2358 08/17/18 0917 08/18/18 0643 08/19/18 0540 08/20/18 0558  NA 141 140 141 140 140  K 3.5 3.5 4.2 4.5 4.6  CL 105 108 106 105 104  CO2 '30 27 27 28 29  '$ GLUCOSE 90 94 104* 92 108*  BUN 6 5* '7 7 8  '$ CREATININE 1.04 1.03 0.94 1.02 0.97  CALCIUM 7.5* 7.5* 8.0* 8.2* 8.3*  MG  --  2.1 2.1 2.1 2.1  PHOS  --  2.9 2.0* 2.8 3.1   GFR: Estimated Creatinine Clearance: 157.6 mL/min (by C-G formula based on SCr of 0.97 mg/dL). Liver Function Tests: Recent Labs  Lab 08/16/18 2358 08/17/18 0917 08/18/18 0643 08/19/18 0540 08/20/18 0558  AST 694* 825* 911* 1,095* 1,010*  ALT 77* 87* 107* 131* 144*  ALKPHOS 27* 24* 28* 28* 29*  BILITOT 0.5 0.5 0.6 0.6 0.3  PROT 5.4* 5.2* 5.4* 5.4* 5.5*  ALBUMIN 2.8* 2.6* 2.8* 2.6* 2.6*   No results for input(s): LIPASE, AMYLASE in the last 168 hours. No  results for input(s): AMMONIA in the last 168 hours. Coagulation Profile: Recent Labs  Lab 08/19/18 0924  INR 0.96   Cardiac Enzymes: Recent Labs  Lab 08/18/18 2305 08/19/18 0540 08/19/18 1502 08/19/18 2216 08/20/18 0558  CKTOTAL >50,000* >50,000* >50,000* >50,000* >50,000*  CKMB 32.2* 32.1* 31.0* 26.9* 20.5*   BNP (last 3 results) No results for input(s): PROBNP in the last 8760 hours. HbA1C: No results for input(s): HGBA1C in the  last 72 hours. CBG: No results for input(s): GLUCAP in the last 168 hours. Lipid Profile: No results for input(s): CHOL, HDL, LDLCALC, TRIG, CHOLHDL, LDLDIRECT in the last 72 hours. Thyroid Function Tests: No results for input(s): TSH, T4TOTAL, FREET4, T3FREE, THYROIDAB in the last 72 hours. Anemia Panel: No results for input(s): VITAMINB12, FOLATE, FERRITIN, TIBC, IRON, RETICCTPCT in the last 72 hours. Sepsis Labs: No results for input(s): PROCALCITON, LATICACIDVEN in the last 168 hours.  No results found for this or any previous visit (from the past 240 hour(s)).   Radiology Studies: No results found.  Scheduled Meds: . oseltamivir  75 mg Oral BID  . polyethylene glycol  17 g Oral BID  . senna-docusate  1 tablet Oral BID   Continuous Infusions: . sodium chloride 100 mL/hr at 08/20/18 0930  .  sodium bicarbonate  infusion 1000 mL 50 mL/hr at 08/20/18 0931    LOS: 4 days   Kerney Elbe, DO [facility] PAGER is on AMION  If 7PM-7AM, please contact night-coverage [email or web address] Password [facility] 08/20/2018, 11:42 AM

## 2018-08-21 LAB — CBC WITH DIFFERENTIAL/PLATELET
Abs Immature Granulocytes: 0.08 10*3/uL — ABNORMAL HIGH (ref 0.00–0.07)
Basophils Absolute: 0 10*3/uL (ref 0.0–0.1)
Basophils Relative: 0 %
Eosinophils Absolute: 0.2 10*3/uL (ref 0.0–0.5)
Eosinophils Relative: 2 %
HCT: 42.7 % (ref 39.0–52.0)
Hemoglobin: 14.3 g/dL (ref 13.0–17.0)
Immature Granulocytes: 1 %
LYMPHS ABS: 2.1 10*3/uL (ref 0.7–4.0)
LYMPHS PCT: 27 %
MCH: 27.8 pg (ref 26.0–34.0)
MCHC: 33.5 g/dL (ref 30.0–36.0)
MCV: 82.9 fL (ref 80.0–100.0)
MONOS PCT: 4 %
Monocytes Absolute: 0.3 10*3/uL (ref 0.1–1.0)
Neutro Abs: 5.2 10*3/uL (ref 1.7–7.7)
Neutrophils Relative %: 66 %
Platelets: 272 10*3/uL (ref 150–400)
RBC: 5.15 MIL/uL (ref 4.22–5.81)
RDW: 12.7 % (ref 11.5–15.5)
WBC: 7.9 10*3/uL (ref 4.0–10.5)
nRBC: 0 % (ref 0.0–0.2)

## 2018-08-21 LAB — CK TOTAL AND CKMB (NOT AT ARMC)
CK, MB: 21 ng/mL — ABNORMAL HIGH (ref 0.5–5.0)
CK, MB: 16.3 ng/mL — AB (ref 0.5–5.0)
CK, MB: 16.6 ng/mL — ABNORMAL HIGH (ref 0.5–5.0)
Total CK: >50000 U/L — ABNORMAL HIGH (ref 49–397)
Total CK: >50000 U/L — ABNORMAL HIGH (ref 49–397)
Total CK: >50000 U/L — ABNORMAL HIGH (ref 49–397)

## 2018-08-21 LAB — COMPREHENSIVE METABOLIC PANEL
ALK PHOS: 30 U/L — AB (ref 38–126)
ALT: 150 U/L — ABNORMAL HIGH (ref 0–44)
AST: 942 U/L — ABNORMAL HIGH (ref 15–41)
Albumin: 2.7 g/dL — ABNORMAL LOW (ref 3.5–5.0)
Anion gap: 8 (ref 5–15)
BUN: 10 mg/dL (ref 6–20)
CO2: 29 mmol/L (ref 22–32)
Calcium: 8.3 mg/dL — ABNORMAL LOW (ref 8.9–10.3)
Chloride: 103 mmol/L (ref 98–111)
Creatinine, Ser: 1.01 mg/dL (ref 0.61–1.24)
GFR calc Af Amer: >60 mL/min (ref >60)
GFR calc non Af Amer: >60 mL/min (ref >60)
Glucose, Bld: 108 mg/dL — ABNORMAL HIGH (ref 70–99)
Potassium: 4.6 mmol/L (ref 3.5–5.1)
Sodium: 140 mmol/L (ref 135–145)
Total Bilirubin: 0.7 mg/dL (ref 0.3–1.2)
Total Protein: 5.5 g/dL — ABNORMAL LOW (ref 6.5–8.1)

## 2018-08-21 LAB — PHOSPHORUS: Phosphorus: 3.7 mg/dL (ref 2.5–4.6)

## 2018-08-21 LAB — MAGNESIUM: Magnesium: 2.1 mg/dL (ref 1.7–2.4)

## 2018-08-21 LAB — HEMOGLOBIN A1C
Hgb A1c MFr Bld: 6.1 % — ABNORMAL HIGH (ref 4.8–5.6)
Mean Plasma Glucose: 128.37 mg/dL

## 2018-08-21 NOTE — Progress Notes (Signed)
PROGRESS NOTE    ISSAAC Rios  WIO:[id number] DOB: 1986-02-14 DOA: 08/16/2018 PCP: Patient, No Pcp Per   Brief Narrative:  HPI per [name] on 08/16/2018 Paul Rios is a 33 y.o. male with no medical history except mild hypertension who comes in with 1 to 2 days of pretty bad body aches and pain.  He went to urgent care yesterday he was told he had influenza although they did not do any labs or check him for the flu and sent him home on an albuterol inhaler.  He was wheezing.  He has no history of wheezing in the past.  He had been coughing some.  He denies any high fevers at home he denies any fevers or chills.  He denies any nausea vomiting diarrhea.  He denies any new medications.  He denies any recent traumatic injury or increase in physical activity.  He does not lift weights.  He denies any usage of any illegal drugs such as methamphetamine or cocaine.  He denies any rashes he denies any joint pains.  He has noticed that his urine is darker and more reddish in color today.  Patient has no history of rheumatological disorders in his past.  He denies any rashes.  Today's found to have a CPK level of over 50,000 undetectable with normal BUN and creatinine with mildly bumped LFTs.  He is referred for admission for rhabdomyolysis.  He is received 2 L of IV fluids and is still complaining of a lot of body aches.  **Still receiving IVF Hydration and complaining of bodyaches. CK's remaining elevated still and LFTs slightly worsening.  Case was discussed with both the nephrologist as well as the gastroenterologist who believes that this is all from his rhabdo from the flu.  They recommend continuing IV fluid hydration.  Dr. [name] recommended obtain a GGT which was normal as well as an INR which was also normal.  Dr. [name] of nephrology recommended adding a little bit of bicarbonate to patient's fluid.  Continuing aggressive hydration at this time as CKMB is improving along with AST. CK is  still >50,000 at this time.   Assessment & Plan:   Principal Problem:   Rhabdomyolysis Active Problems:   Proteinuria   Influenza B   Hypokalemia   Obesity (BMI 30-39.9)  Acute Rhabdomyolysis 2/2 to Influenza B -Unclear Etiology but most likley the Flue; No Hx of Seizures, Strenuous Exercise, Prolonged Immobility; Has been taking OTC Tylenol Cold and Flu and Unbranded Nyquil -Stop all medications with Acetaminophen in it -Urinalysis showed cloudy appearance with small bilirubin, brown color urine, large hemoglobin on the urine dipstick, 15 ketones trace leukocytes, greater than 300 protein, rare bacteria and RBCs per high-power field were 6-10 -CK has been >50,000 x15 so far and CK-MB elevated and went from 44.8 -> 52.1 and peaked and now trending down to 16.3; will continue to cycle CK and CK-MBs every 8 hours Given 4 Liters of NS and started Maintenance IVF at 150 mL/hr; -Maintenance IVF changed to 100 mL/hr and added 100 mEQ of Sodium Bicarbonate to D5W and will be running that at 50 mL/hr to supplement and will continue at this time -ESR was 2 -Continue with pain control and have discontinued his Hydrocodone-Acetaminophen will place on oxycodone 5 mg every 4 PRN for moderate pain; currently no indication for IV pain medication -Strict I's and O's; Patient is +7.819 Liters since Admission -Will Ambulate the Patient but patient felt Lethargic -Continue to monitor and  repeat CKs and follow trend -Discussed with Nephrology and they recommend if his GFR starts to drop, to call them for formal consultation  Proteinuria -Had greater than 300 protein on his initial urinalysis -Repeat U/A showed 100 Protein  -C/w IVF Hydration as above  Influenza B -Still positive for influenza B via PCR -Started Tamiflu 75 mg p.o. twice daily x5 days and will continue course until completion  -Continue with supportive care with Antiemetics with po/IV Zofran q6hprn -Have discontinued his antipyretics  with Acetaminophen given abnormal LFTs -Continue with fluid hydration as above  Hypokalemia -Was mild on admission at 3.3 and now improved to 4.6 today -Continue to monitor replete as necessary -Repeat CMP in the a.m.  Hypophosphatemia -Patient's Phos Level this AM was 3.7 -Continue to Monitor and Replete as Necessary -Repeat Phos Level in AM   Abnormal LFTs, now appearing stable -In setting of rhabdomyolysis but rule out other etiologies -AST went from 688 on admission and peaked at 1095 and repeat this AM is 942 -ALT went from 77 is now 150 but stable from yesterday -Alk phos is low and T bili is normal; GGT also normal at 23 -PT-INR checked and was 12.6 and 0.96 -Acute Hepatitis Panel Negative -RUQ U/S showed Increased heterogeneous echotexture in the liver without a focal mass is nonspecific but may represent hepatic steatosis. No other abnormalities. -Continue to monitor and trend hepatic function panel -Discussed Case with Dr. [name] of Gastroenterology who feels this is all muscular cause of Abnormal LFT's and feels that there is no cholestatic injury. He recommends continuing IVF and expects LFT's to improve once Rhabdomyolysis improves -Repeat CMP in AM   Obesity -Estimated body mass index is 39.21 kg/m as calculated from the following:   Height as of this encounter: '6\' 1"'$  (1.854 m).   Weight as of this encounter: 134.8 kg. -Weight Loss Counseling given   Constipation -Started the patient on Miralax 17 grams po BID, Senna-Docusate 1 tab po BID and Bisacodyl 10 mg RC Dailyprn  Hyperglycemia in the setting of Pre-Diabetes -Checked HbA1c and was 6.1 -Ranging from 92-108 on Daily CMP's/BMPs -Continue to Monitor Blood Sugars carefully and if remaining elevated place on Sensitive Novolog SSI -Recommended Lifestyle Modification with Exercise and Dietary Changes   DVT prophylaxis: SCDs; Ambulation  Code Status: FULL CODE Family Communication: No Family present at  bedside Disposition Plan: Remain inpatient for continued fluid hydration and resolution of rhabdomyolysis  Consultants:   Discussed case with Nephrology Dr. [name]  Discussed case with Gastroenterology Dr. [name]    Procedures: RUQ U/S  Antimicrobials:  Anti-infectives (From admission, onward)   Start     Dose/Rate Route Frequency Ordered Stop   08/17/18 1000  oseltamivir (TAMIFLU) capsule 75 mg     75 mg Oral 2 times daily 08/17/18 0818 08/22/18 0959     Subjective: Seen and examined at bedside and states that he is still a little sore especially in his arms and his legs but not as bad as he was yesterday.  No chest pain, lightheadedness or dizziness.  No nausea or vomiting.  I discussed and counseled with him about being prediabetic and he states that he will make lifestyle modifications.  No other concerns or complaints at this time  Objective: Vitals:   08/20/18 0953 08/20/18 1826 08/20/18 2047 08/21/18 0603  BP: (!) 144/94 (!) 152/86 (!) 159/100 130/69  Pulse: 72 77 71 66  Resp: '19 19 18 18  '$ Temp: 98.3 F (36.8 C) 98.6 F (37  C) 98.8 F (37.1 C) 98.4 F (36.9 C)  TempSrc: Oral Oral Oral   SpO2: 99% 98% 97% 98%  Weight:   134.8 kg   Height:        Intake/Output Summary (Last 24 hours) at 08/21/2018 0847 Last data filed at 08/21/2018 0600 Gross per 24 hour  Intake 4955 ml  Output 3650 ml  Net 1305 ml   Filed Weights   08/18/18 2029 08/19/18 2052 08/20/18 2047  Weight: 134.8 kg 134.8 kg 134.8 kg   Examination: Physical Exam:  Constitutional: Well-nourished, well-developed obese African-American male currently no acute distress appears calm laying in bed and appears more comfortable today Eyes: Lids extract are normal.  Sclera anicteric ENMT: External ears and nose appear normal.  Grossly normal hearing Neck: Appears supple no JVD Respiratory: Diminished auscultation bilaterally very slightly.  No appreciable wheezing, rales, rhonchi.  Unlabored breathing is not  using any accessory muscles to breathe Cardiovascular: Regular rate and rhythm.  No appreciable murmurs, rubs, gallops.  Mild lower extremity edema noted Abdomen: Soft, nontender, distended secondary body habitus.  Bowel sounds present GU: Deferred Musculoskeletal: No contractures or cyanosis noted.  No joint deformities noted in upper or lower extremities Skin: Skin is warm and dry no appreciable rashes or lesions on physical evaluation.  No noticeable bruising but does have some skin tattoos specifically on his chest Neurologic: Cranial nerves II through XII grossly intact no appreciable focal deficits Psychiatric: Pleasant mood and affect today.  Intact judgment insight.  Not as frustrated today  Data Reviewed: I have personally reviewed following labs and imaging studies  CBC: Recent Labs  Lab 08/17/18 0917 08/18/18 0643 08/19/18 0540 08/20/18 0558 08/21/18 0647  WBC 4.7 6.0 6.4 7.6 7.9  NEUTROABS 2.5 4.0 3.9 4.8 5.2  HGB 14.5 14.6 14.2 14.0 14.3  HCT 42.7 43.7 41.7 41.3 42.7  MCV 82.6 83.4 82.7 83.1 82.9  PLT 161 187 201 229 025   Basic Metabolic Panel: Recent Labs  Lab 08/17/18 0917 08/18/18 0643 08/19/18 0540 08/20/18 0558 08/21/18 0647  NA 140 141 140 140 140  K 3.5 4.2 4.5 4.6 4.6  CL 108 106 105 104 103  CO2 '27 27 28 29 29  '$ GLUCOSE 94 104* 92 108* 108*  BUN 5* '7 7 8 10  '$ CREATININE 1.03 0.94 1.02 0.97 1.01  CALCIUM 7.5* 8.0* 8.2* 8.3* 8.3*  MG 2.1 2.1 2.1 2.1 2.1  PHOS 2.9 2.0* 2.8 3.1 3.7   GFR: Estimated Creatinine Clearance: 151.3 mL/min (by C-G formula based on SCr of 1.01 mg/dL). Liver Function Tests: Recent Labs  Lab 08/17/18 0917 08/18/18 0643 08/19/18 0540 08/20/18 0558 08/21/18 0647  AST 825* 911* 1,095* 1,010* 942*  ALT 87* 107* 131* 144* 150*  ALKPHOS 24* 28* 28* 29* 30*  BILITOT 0.5 0.6 0.6 0.3 0.7  PROT 5.2* 5.4* 5.4* 5.5* 5.5*  ALBUMIN 2.6* 2.8* 2.6* 2.6* 2.7*   No results for input(s): LIPASE, AMYLASE in the last 168 hours. No  results for input(s): AMMONIA in the last 168 hours. Coagulation Profile: Recent Labs  Lab 08/19/18 0924  INR 0.96   Cardiac Enzymes: Recent Labs  Lab 08/19/18 2216 08/20/18 0558 08/20/18 1430 08/20/18 2329 08/21/18 0647  CKTOTAL >50,000* >50,000* >50,000* >50,000* PENDING  CKMB 26.9* 20.5* 24.9* 21.0* 16.3*   BNP (last 3 results) No results for input(s): PROBNP in the last 8760 hours. HbA1C: Recent Labs    08/21/18 0647  HGBA1C 6.1*   CBG: No results for input(s): GLUCAP in  the last 168 hours. Lipid Profile: No results for input(s): CHOL, HDL, LDLCALC, TRIG, CHOLHDL, LDLDIRECT in the last 72 hours. Thyroid Function Tests: No results for input(s): TSH, T4TOTAL, FREET4, T3FREE, THYROIDAB in the last 72 hours. Anemia Panel: No results for input(s): VITAMINB12, FOLATE, FERRITIN, TIBC, IRON, RETICCTPCT in the last 72 hours. Sepsis Labs: No results for input(s): PROCALCITON, LATICACIDVEN in the last 168 hours.  No results found for this or any previous visit (from the past 240 hour(s)).   Radiology Studies: No results found.  Scheduled Meds: . oseltamivir  75 mg Oral BID  . polyethylene glycol  17 g Oral BID  . senna-docusate  1 tablet Oral BID   Continuous Infusions: . sodium chloride 100 mL/hr at 08/21/18 0527  .  sodium bicarbonate  infusion 1000 mL 50 mL/hr at 08/21/18 0528    LOS: 5 days   Kerney Elbe, DO [facility] PAGER is on AMION  If 7PM-7AM, please contact night-coverage [email or web address] Password [facility] 08/21/2018, 8:47 AM

## 2018-08-21 NOTE — Progress Notes (Signed)
Patient has been informed that he is not to leave the unit w/o staff present.  Staff is unable to stroll around hospital with patient at this time.  Patient left unit against staff advice.  He stated he will be back.  He just want to walk around and go downstairs.

## 2018-08-22 LAB — CBC WITH DIFFERENTIAL/PLATELET
Abs Immature Granulocytes: 0 10*3/uL (ref 0.00–0.07)
BASOS PCT: 0 %
Basophils Absolute: 0 10*3/uL (ref 0.0–0.1)
Eosinophils Absolute: 0.2 10*3/uL (ref 0.0–0.5)
Eosinophils Relative: 2 %
HCT: 42 % (ref 39.0–52.0)
Hemoglobin: 14.3 g/dL (ref 13.0–17.0)
Lymphocytes Relative: 21 %
Lymphs Abs: 1.7 10*3/uL (ref 0.7–4.0)
MCH: 28.1 pg (ref 26.0–34.0)
MCHC: 34 g/dL (ref 30.0–36.0)
MCV: 82.7 fL (ref 80.0–100.0)
Monocytes Absolute: 0.3 10*3/uL (ref 0.1–1.0)
Monocytes Relative: 4 %
Neutro Abs: 6.1 10*3/uL (ref 1.7–7.7)
Neutrophils Relative %: 73 %
PLATELETS: 285 10*3/uL (ref 150–400)
RBC: 5.08 MIL/uL (ref 4.22–5.81)
RDW: 12.5 % (ref 11.5–15.5)
WBC: 8.3 10*3/uL (ref 4.0–10.5)
nRBC: 0 % (ref 0.0–0.2)
nRBC: 0 /100 WBC

## 2018-08-22 LAB — CK TOTAL AND CKMB (NOT AT ARMC)
CK, MB: 19.6 ng/mL — ABNORMAL HIGH (ref 0.5–5.0)
CK, MB: 17.2 ng/mL — ABNORMAL HIGH (ref 0.5–5.0)
CK, MB: 16.9 ng/mL — ABNORMAL HIGH (ref 0.5–5.0)
Total CK: >50000 U/L — ABNORMAL HIGH (ref 49–397)
Total CK: >50000 U/L — ABNORMAL HIGH (ref 49–397)
Total CK: >50000 U/L — ABNORMAL HIGH (ref 49–397)

## 2018-08-22 LAB — COMPREHENSIVE METABOLIC PANEL
ALT: 141 U/L — ABNORMAL HIGH (ref 0–44)
AST: 753 U/L — ABNORMAL HIGH (ref 15–41)
Albumin: 2.6 g/dL — ABNORMAL LOW (ref 3.5–5.0)
Alkaline Phosphatase: 34 U/L — ABNORMAL LOW (ref 38–126)
Anion gap: 11 (ref 5–15)
BILIRUBIN TOTAL: 0.3 mg/dL (ref 0.3–1.2)
BUN: 9 mg/dL (ref 6–20)
CO2: 28 mmol/L (ref 22–32)
Calcium: 8.6 mg/dL — ABNORMAL LOW (ref 8.9–10.3)
Chloride: 99 mmol/L (ref 98–111)
Creatinine, Ser: 0.94 mg/dL (ref 0.61–1.24)
GFR calc Af Amer: >60 mL/min (ref >60)
Glucose, Bld: 131 mg/dL — ABNORMAL HIGH (ref 70–99)
Potassium: 4 mmol/L (ref 3.5–5.1)
Sodium: 138 mmol/L (ref 135–145)
Total Protein: 5.6 g/dL — ABNORMAL LOW (ref 6.5–8.1)

## 2018-08-22 LAB — PHOSPHORUS: Phosphorus: 3.5 mg/dL (ref 2.5–4.6)

## 2018-08-22 LAB — MAGNESIUM: Magnesium: 2.1 mg/dL (ref 1.7–2.4)

## 2018-08-22 NOTE — Progress Notes (Signed)
PROGRESS NOTE    Paul Rios  JME:[id number] DOB: 05-02-1986 DOA: 08/16/2018 PCP: Patient, No Pcp Per   Brief Narrative:  HPI per [name] on 08/16/2018 Paul Rios is a 33 y.o. male with no medical history except mild hypertension who comes in with 1 to 2 days of pretty bad body aches and pain.  He went to urgent care yesterday he was told he had influenza although they did not do any labs or check him for the flu and sent him home on an albuterol inhaler.  He was wheezing.  He has no history of wheezing in the past.  He had been coughing some.  He denies any high fevers at home he denies any fevers or chills.  He denies any nausea vomiting diarrhea.  He denies any new medications.  He denies any recent traumatic injury or increase in physical activity.  He does not lift weights.  He denies any usage of any illegal drugs such as methamphetamine or cocaine.  He denies any rashes he denies any joint pains.  He has noticed that his urine is darker and more reddish in color today.  Patient has no history of rheumatological disorders in his past.  He denies any rashes.  Today's found to have a CPK level of over 50,000 undetectable with normal BUN and creatinine with mildly bumped LFTs.  He is referred for admission for rhabdomyolysis.  He is received 2 L of IV fluids and is still complaining of a lot of body aches.  **Still receiving IVF Hydration and complaining of bodyaches. CK's remaining elevated still and LFTs slightly worsening.  Case was discussed with both the nephrologist as well as the gastroenterologist who believes that this is all from his rhabdo from the flu.  They recommend continuing IV fluid hydration.  Dr. [name] recommended obtain a GGT which was normal as well as an INR which was also normal.  Dr. [name] of nephrology recommended adding a little bit of bicarbonate to patient's fluid.  Continuing aggressive hydration at this time and increased Fluid Rate as CKMB is  improving along with AST and now ALT. CK is still >50,000 at this time.   Assessment & Plan:   Principal Problem:   Rhabdomyolysis Active Problems:   Proteinuria   Influenza B   Hypokalemia   Obesity (BMI 30-39.9)  Acute Rhabdomyolysis 2/2 to Influenza B -Unclear Etiology but most likley the Flue; No Hx of Seizures, Strenuous Exercise, Prolonged Immobility; Has been taking OTC Tylenol Cold and Flu and Unbranded Nyquil -Stop all medications with Acetaminophen in it -Urinalysis showed cloudy appearance with small bilirubin, brown color urine, large hemoglobin on the urine dipstick, 15 ketones trace leukocytes, greater than 300 protein, rare bacteria and RBCs per high-power field were 6-10 -CK has been >50,000 x18 so far and CK-MB elevated and went from 44.8 -> 52.1 and peaked and now trending down to 17.2; will continue to cycle CK and CK-MBs every 8 hours Given 4 Liters of NS and started Maintenance IVF at 150 mL/hr; -Maintenance IVF changed to 150 mL/hr of NS this AM and added 100 mEQ of Sodium Bicarbonate to D5W and will be running that at 50 mL/hr to supplement and will continue at this time -ESR was 2 -Continue with pain control and have discontinued his Hydrocodone-Acetaminophen will place on oxycodone 5 mg every 4 PRN for moderate pain; currently no indication for IV pain medication -Strict I's and O's; Patient is +10.810 Liters since Admission -Will  Ambulate the Patient but patient felt Lethargic -Continue to monitor and repeat CKs and follow trend -Discussed with Nephrology and they recommend if his GFR starts to drop, to call them for formal consultation  Proteinuria -Had greater than 300 protein on his initial urinalysis -Repeat U/A showed 100 Protein  -C/w IVF Hydration as above  Influenza B -Still positive for influenza B via PCR -Started Tamiflu 75 mg p.o. twice daily x5 days and now completed course -Continue with supportive care with Antiemetics with po/IV Zofran  [id number] -Have discontinued his antipyretics with Acetaminophen given abnormal LFTs -Continue with fluid hydration as above  Hypokalemia -Was mild on admission at 3.3 and now improved to 4.0 today -Continue to monitor replete as necessary -Repeat CMP in the a.m.  Hypophosphatemia -Patient's Phos Level this AM was 3.5 -Continue to Monitor and Replete as Necessary -Repeat Phos Level in AM   Abnormal LFTs, now appearing stable -In setting of rhabdomyolysis but rule out other etiologies -AST went from 688 on admission and peaked at 1095 and repeat this AM is 753 -ALT went from 77 and peaked at 150 and is now 141 -Alk phos is low and T bili is normal; GGT also normal at 23 -PT-INR checked and was 12.6 and 0.96 -Acute Hepatitis Panel Negative -RUQ U/S showed Increased heterogeneous echotexture in the liver without a focal mass is nonspecific but may represent hepatic steatosis. No other abnormalities. -Continue to monitor and trend hepatic function panel -Discussed Case with Dr. [name] of Gastroenterology who feels this is all muscular cause of Abnormal LFT's and feels that there is no cholestatic injury. He recommends continuing IVF and expects LFT's to improve once Rhabdomyolysis improves -Repeat CMP in AM   Obesity -Estimated body mass index is 38.63 kg/m as calculated from the following:   Height as of this encounter: _0  (1.854 m).   Weight as of this encounter: 132.8 kg. -Weight Loss Counseling given   Constipation -Started the patient on Miralax 17 grams po BID, Senna-Docusate 1 tab po BID and Bisacodyl 10 mg RC Dailyprn  Hyperglycemia in the setting of Pre-Diabetes -Checked HbA1c and was 6.1 -Ranging from 92-131 on Daily CMP's/BMPs -Continue to Monitor Blood Sugars carefully and if remaining elevated place on Sensitive Novolog SSI -Recommended Lifestyle Modification with Exercise and Dietary Changes   DVT prophylaxis: SCDs; Ambulation  Code Status: FULL CODE Family  Communication: No Family present at bedside Disposition Plan: Remain inpatient for continued fluid hydration and resolution of rhabdomyolysis  Consultants:   Discussed case with Nephrology Dr. [name]  Discussed case with Gastroenterology Dr. [name]    Procedures: RUQ U/S  Antimicrobials:  Anti-infectives (From admission, onward)   Start     Dose/Rate Route Frequency Ordered Stop   08/17/18 1000  oseltamivir (TAMIFLU) capsule 75 mg     75 mg Oral 2 times daily 08/17/18 0818 08/21/18 2208     Subjective: Seen and examined at bedside and states that he is not a sore today but woke up extremely sore with a contracture in his left arm.  No nausea or vomiting.  States that he feels that his urine is clearing up.  States that he had not required a pain medicine until late last night.  No other concerns or complaints at this time and he is happy that his liver functions are trending down.  Objective: Vitals:   08/21/18 1726 08/21/18 1941 08/22/18 0429 08/22/18 0954  BP: (!) 147/104 (!) 152/94 (!) 145/86 (!) 165/100  Pulse: 81 79  68 77  Resp: _0 Temp: 98.8 F (37.1 C) 98.9 F (37.2 C) 98.4 F (36.9 C) 98.5 F (36.9 C)  TempSrc: Oral Oral Oral Oral  SpO2: 98% 98% 99% 97%  Weight:  132.8 kg    Height:        Intake/Output Summary (Last 24 hours) at 08/22/2018 1127 Last data filed at 08/22/2018 0900 Gross per 24 hour  Intake 4191.46 ml  Output 1200 ml  Net 2991.46 ml   Filed Weights   08/19/18 2052 08/20/18 2047 08/21/18 1941  Weight: 134.8 kg 134.8 kg 132.8 kg   Examination: Physical Exam:  Constitutional: Well-nourished, well-developed obese African-American male currently no acute distress appears improved today and appears comfortable Eyes: Lids and conjunctive are normal.  Sclerae anicteric ENMT: External ears and nose appear normal.  Grossly normal hearing Neck: Supple with no JVD Respiratory: Slightly diminished auscultation bilaterally no appreciable  wheezing, rales, rhonchi. Cardiovascular: Regular rate and rhythm.  No appreciable murmurs, rubs, gallops.  Trace lower extremity edema noted Abdomen: Soft, nontender, distended secondary body habitus.  Bowel sounds present GU: Deferred Musculoskeletal: No contractures or cyanosis.  No joint deformities in the upper and lower extremities Skin: Skin is warm and dry no appreciable rashes or lesions on limited skin evaluation.  Does have some skin tattoos on his chest but no noticeable bruising Neurologic: Cranial nerves II through XII grossly intact no appreciable focal deficits Psychiatric: Pleasant mood and affect.  Intact judgment and insight.  Data Reviewed: I have personally reviewed following labs and imaging studies  CBC: Recent Labs  Lab 08/18/18 0643 08/19/18 0540 08/20/18 0558 08/21/18 0647 08/22/18 0437  WBC 6.0 6.4 7.6 7.9 8.3  NEUTROABS 4.0 3.9 4.8 5.2 6.1  HGB 14.6 14.2 14.0 14.3 14.3  HCT 43.7 41.7 41.3 42.7 42.0  MCV 83.4 82.7 83.1 82.9 82.7  PLT 187 201 229 272 045   Basic Metabolic Panel: Recent Labs  Lab 08/18/18 0643 08/19/18 0540 08/20/18 0558 08/21/18 0647 08/22/18 0437  NA 141 140 140 140 138  K 4.2 4.5 4.6 4.6 4.0  CL 106 105 104 103 99  CO2 _1 GLUCOSE 104* 92 108* 108* 131*  BUN _2 CREATININE 0.94 1.02 0.97 1.01 0.94  CALCIUM 8.0* 8.2* 8.3* 8.3* 8.6*  MG 2.1 2.1 2.1 2.1 2.1  PHOS 2.0* 2.8 3.1 3.7 3.5   GFR: Estimated Creatinine Clearance: 161.3 mL/min (by C-G formula based on SCr of 0.94 mg/dL). Liver Function Tests: Recent Labs  Lab 08/18/18 0643 08/19/18 0540 08/20/18 0558 08/21/18 0647 08/22/18 0437  AST 911* 1,095* 1,010* 942* 753*  ALT 107* 131* 144* 150* 141*  ALKPHOS 28* 28* 29* 30* 34*  BILITOT 0.6 0.6 0.3 0.7 0.3  PROT 5.4* 5.4* 5.5* 5.5* 5.6*  ALBUMIN 2.8* 2.6* 2.6* 2.7* 2.6*   No results for input(s): LIPASE, AMYLASE in the last 168 hours. No results for input(s): AMMONIA in the last 168  hours. Coagulation Profile: Recent Labs  Lab 08/19/18 0924  INR 0.96   Cardiac Enzymes: Recent Labs  Lab 08/20/18 2329 08/21/18 0647 08/21/18 1500 08/21/18 2353 08/22/18 0748  CKTOTAL >50,000* >50,000* >50,000* >50,000* >50,000*  CKMB 21.0* 16.3* 16.6* 19.6* 17.2*   BNP (last 3 results) No results for input(s): PROBNP in the last 8760 hours. HbA1C: Recent Labs    08/21/18 0647  HGBA1C 6.1*   CBG: No results for input(s): GLUCAP in the last 168 hours. Lipid  Profile: No results for input(s): CHOL, HDL, LDLCALC, TRIG, CHOLHDL, LDLDIRECT in the last 72 hours. Thyroid Function Tests: No results for input(s): TSH, T4TOTAL, FREET4, T3FREE, THYROIDAB in the last 72 hours. Anemia Panel: No results for input(s): VITAMINB12, FOLATE, FERRITIN, TIBC, IRON, RETICCTPCT in the last 72 hours. Sepsis Labs: No results for input(s): PROCALCITON, LATICACIDVEN in the last 168 hours.  No results found for this or any previous visit (from the past 240 hour(s)).   Radiology Studies: No results found.  Scheduled Meds: . polyethylene glycol  17 g Oral BID  . senna-docusate  1 tablet Oral BID   Continuous Infusions: . sodium chloride 150 mL/hr at 08/22/18 0929  .  sodium bicarbonate  infusion 1000 mL 50 mL/hr at 08/22/18 0412    LOS: 6 days   Kerney Elbe, DO [facility] PAGER is on AMION  If 7PM-7AM, please contact night-coverage [email or web address] Password [email or web address] 08/22/2018, 11:27 AM

## 2018-08-22 NOTE — Progress Notes (Signed)
Patient back on unit safely.  On call MD was notified and on call instructed that patient in not to leave unit without staff and if he does, he will have to return to the ED.  Patient informed and stated he understood instructions.

## 2018-08-22 NOTE — Care Management Note (Signed)
Case Management Note Paul Conradi, RN MSN CCM Transitions of Care [facility] [place] [phone]  Patient Details  Name: Paul Rios MRN: [id number] Date of Birth: [birth year]  Subjective/Objective:      Rhabdomyolysis    Action/Plan: PTA home. Independent. No HH/DME needs. Spoke with patient at bedside concerning no insurance or PCP. Pt stated that the financial counselor had been at bedside to help reapply stating that his medicaid had lapsed. Pt reports that his PCP is at [facility], but he was thinking of changing. Discussed that medicaid can assist him with finding a new PCP. Pt reports that he has transportation to get to medical appts. CM to continue to follow for transition of care needs.   Expected Discharge Date:  08/21/18               Expected Discharge Plan:  Home/Self Care  In-House Referral:  Financial Counselor  Discharge planning Services  CM Consult  Post Acute Care Choice:  NA Choice offered to:  NA  DME Arranged:  N/A DME Agency:  NA  HH Arranged:  NA HH Agency:  NA  Status of Service:  In process, will continue to follow  If discussed at Long Length of Stay Meetings, dates discussed:    Additional Comments:  Paul Kinds, RN 08/22/2018, 3:20 PM

## 2018-08-23 LAB — PHOSPHORUS: PHOSPHORUS: 3.9 mg/dL (ref 2.5–4.6)

## 2018-08-23 LAB — CBC WITH DIFFERENTIAL/PLATELET
Abs Immature Granulocytes: 0.08 10*3/uL — ABNORMAL HIGH (ref 0.00–0.07)
Basophils Absolute: 0 10*3/uL (ref 0.0–0.1)
Basophils Relative: 1 %
EOS PCT: 2 %
Eosinophils Absolute: 0.1 10*3/uL (ref 0.0–0.5)
HEMATOCRIT: 41.8 % (ref 39.0–52.0)
Hemoglobin: 14 g/dL (ref 13.0–17.0)
Immature Granulocytes: 1 %
Lymphocytes Relative: 32 %
Lymphs Abs: 2.3 10*3/uL (ref 0.7–4.0)
MCH: 27.9 pg (ref 26.0–34.0)
MCHC: 33.5 g/dL (ref 30.0–36.0)
MCV: 83.3 fL (ref 80.0–100.0)
Monocytes Absolute: 0.5 10*3/uL (ref 0.1–1.0)
Monocytes Relative: 7 %
Neutro Abs: 4.2 10*3/uL (ref 1.7–7.7)
Neutrophils Relative %: 57 %
Platelets: 297 10*3/uL (ref 150–400)
RBC: 5.02 MIL/uL (ref 4.22–5.81)
RDW: 12.9 % (ref 11.5–15.5)
WBC: 7.1 10*3/uL (ref 4.0–10.5)
nRBC: 0 % (ref 0.0–0.2)

## 2018-08-23 LAB — COMPREHENSIVE METABOLIC PANEL
ALT: 120 U/L — ABNORMAL HIGH (ref 0–44)
AST: 503 U/L — ABNORMAL HIGH (ref 15–41)
Albumin: 2.5 g/dL — ABNORMAL LOW (ref 3.5–5.0)
Alkaline Phosphatase: 28 U/L — ABNORMAL LOW (ref 38–126)
Anion gap: 9 (ref 5–15)
BUN: 9 mg/dL (ref 6–20)
CO2: 28 mmol/L (ref 22–32)
Calcium: 8.4 mg/dL — ABNORMAL LOW (ref 8.9–10.3)
Chloride: 102 mmol/L (ref 98–111)
Creatinine, Ser: 0.85 mg/dL (ref 0.61–1.24)
GFR calc Af Amer: >60 mL/min (ref >60)
GFR calc non Af Amer: >60 mL/min (ref >60)
GLUCOSE: 88 mg/dL (ref 70–99)
Potassium: 4.6 mmol/L (ref 3.5–5.1)
Sodium: 139 mmol/L (ref 135–145)
TOTAL PROTEIN: 5.2 g/dL — AB (ref 6.5–8.1)
Total Bilirubin: 0.7 mg/dL (ref 0.3–1.2)

## 2018-08-23 LAB — CK TOTAL AND CKMB (NOT AT ARMC)
CK, MB: 13.7 ng/mL — ABNORMAL HIGH (ref 0.5–5.0)
Total CK: >50000 U/L — ABNORMAL HIGH (ref 49–397)

## 2018-08-23 LAB — MAGNESIUM: Magnesium: 2.1 mg/dL (ref 1.7–2.4)

## 2018-08-23 NOTE — Progress Notes (Signed)
PROGRESS NOTE    Paul SignsStephen L Rios  OZH:[id number] DOB: [birth year] DOA: 08/16/2018 PCP: Patient, No Pcp Per   Brief Narrative:  HPI on 08/16/2018 by Dr. Eldridge Daceachel David Paul Rios is a 33 y.o. male with no medical history except mild hypertension who comes in with 1 to 2 days of pretty bad body aches and pain.  He went to urgent care yesterday he was told he had influenza although they did not do any labs or check him for the flu and sent him home on an albuterol inhaler.  He was wheezing.  He has no history of wheezing in the past.  He had been coughing some.  He denies any high fevers at home he denies any fevers or chills.  He denies any nausea vomiting diarrhea.  He denies any new medications.  He denies any recent traumatic injury or increase in physical activity.  He does not lift weights.  He denies any usage of any illegal drugs such as methamphetamine or cocaine.  He denies any rashes he denies any joint pains.  He has noticed that his urine is darker and more reddish in color today.  Patient has no history of rheumatological disorders in his past.  He denies any rashes.  Today's found to have a CPK level of over 50,000 undetectable with normal BUN and creatinine with mildly bumped LFTs.  He is referred for admission for rhabdomyolysis.  He is received 2 L of IV fluids and is still complaining of a lot of body aches.  Interim history Patient admitted with acute rhabdomyolysis likely secondary to influenza B.  Continues to receive large amounts of IV fluids, however CK level still over 50,000. Assessment & Plan   Acute rhabdomyolysis secondary to influenza B -Patient had no history of seizure, strenuous exercise, prolonged immobility, drug abuse.  He has been taking over-the-counter Tylenol Cold and flu, NyQuil (generic brands) -UA was cloudy appearance with small bilirubin, brown color urine, large hemoglobin, 15 ketones, trace leukocytes,>300 protein, rare bacteria -CK> 50,000, CK-MB also  elevated however trending downward -Patient with no complaints of chest pain. -Continue IV fluids and pain control -Continue to monitor renal function, currently stable  Influenza B -Patient started Tamiflu and is completed course -Continue supportive care with antiemetics and fluid resuscitation  Proteinuria -Likely secondary to the above, continue IV hydration  Hyperglycemia -Hemoglobin A1c 6.1 -Continue to monitor CBG, insulin sliding scale  Hypokalemia -Resolved with replacement, continue to monitor  Hypophosphatemia -Resolved with replacement, continue to monitor  Abnormal LFTs -Suspect secondary to rhabdo -AST and ALT trending downward -PT/INR checked within normal limits -Acute hepatitis panel unremarkable -Right upper quadrant ultrasound showed increased heterogeneous echotexture in the liver without a focal mass is not consistent but may represent hepatic steatosis.  No other abnormalities -Case was discussed with gastroenterology by previous hospitalist, Dr. Rhea BeltonPyrtle felt this was due to muscular cause of abnormal LFTs and recommended continuing IV fluids  Obesity -BMI 38.63 -Patient to discuss lifestyle modifications with PCP on discharge  Constipation -Continue bowel regimen  DVT Prophylaxis  SCDs  Code Status: Full  Family Communication: None at bedside  Disposition Plan: Admitted. Pending improvement in CK level  Consultants None   Procedures  RUQ  Antibiotics   Anti-infectives (From admission, onward)   Start     Dose/Rate Route Frequency Ordered Stop   08/17/18 1000  oseltamivir (TAMIFLU) capsule 75 mg     75 mg Oral 2 times daily 08/17/18 0818 08/21/18 2208  Subjective:   Paul Rios seen and examined today.  Patient feels his muscles feel weak but are feeling better than previous days and not as tight.  Denies current chest pain, shortness breath, abdominal pain, nausea or vomiting, diarrhea constipation, dizziness or  headache.  Objective:   Vitals:   08/22/18 1707 08/22/18 2113 08/23/18 0541 08/23/18 0908  BP: (!) 160/92 (!) 154/89 133/68 127/83  Pulse: 65 76 (!) 49 89  Resp: 18 20 18 18   Temp: 99.1 F (37.3 C) 99.3 F (37.4 C) 97.9 F (36.6 C) 98.9 F (37.2 C)  TempSrc: Oral Oral Oral Oral  SpO2: 98% 98% 99% 98%  Weight:   132.4 kg   Height:        Intake/Output Summary (Last 24 hours) at 08/23/2018 1407 Last data filed at 08/23/2018 1350 Gross per 24 hour  Intake 4990 ml  Output 3500 ml  Net 1490 ml   Filed Weights   08/20/18 2047 08/21/18 1941 08/23/18 0541  Weight: 134.8 kg 132.8 kg 132.4 kg    Exam  General: Well developed, well nourished, NAD, appears stated age  HEENT: NCAT, mucous membranes moist.   Neck: Supple  Cardiovascular: S1 S2 auscultated, RRR, no murmur  Respiratory: Clear to auscultation bilaterally with equal chest rise  Abdomen: Soft, nontender, nondistended, + bowel sounds  Extremities: warm dry without cyanosis clubbing or edema  Neuro: AAOx3, Nonfocal  Psych: Normal affect and demeanor    Data Reviewed: I have personally reviewed following labs and imaging studies  CBC: Recent Labs  Lab 08/19/18 0540 08/20/18 0558 08/21/18 0647 08/22/18 0437 08/23/18 0717  WBC 6.4 7.6 7.9 8.3 7.1  NEUTROABS 3.9 4.8 5.2 6.1 4.2  HGB 14.2 14.0 14.3 14.3 14.0  HCT 41.7 41.3 42.7 42.0 41.8  MCV 82.7 83.1 82.9 82.7 83.3  PLT 201 229 272 285 297   Basic Metabolic Panel: Recent Labs  Lab 08/19/18 0540 08/20/18 0558 08/21/18 0647 08/22/18 0437 08/23/18 0717  NA 140 140 140 138 139  K 4.5 4.6 4.6 4.0 4.6  CL 105 104 103 99 102  CO2 28 29 29 28 28   GLUCOSE 92 108* 108* 131* 88  BUN 7 8 10 9 9   CREATININE 1.02 0.97 1.01 0.94 0.85  CALCIUM 8.2* 8.3* 8.3* 8.6* 8.4*  MG 2.1 2.1 2.1 2.1 2.1  PHOS 2.8 3.1 3.7 3.5 3.9   GFR: Estimated Creatinine Clearance: 178.1 mL/min (by C-G formula based on SCr of 0.85 mg/dL). Liver Function Tests: Recent Labs  Lab  08/19/18 0540 08/20/18 0558 08/21/18 0647 08/22/18 0437 08/23/18 0717  AST 1,095* 1,010* 942* 753* 503*  ALT 131* 144* 150* 141* 120*  ALKPHOS 28* 29* 30* 34* 28*  BILITOT 0.6 0.3 0.7 0.3 0.7  PROT 5.4* 5.5* 5.5* 5.6* 5.2*  ALBUMIN 2.6* 2.6* 2.7* 2.6* 2.5*   No results for input(s): LIPASE, AMYLASE in the last 168 hours. No results for input(s): AMMONIA in the last 168 hours. Coagulation Profile: Recent Labs  Lab 08/19/18 0924  INR 0.96   Cardiac Enzymes: Recent Labs  Lab 08/21/18 1500 08/21/18 2353 08/22/18 0748 08/22/18 1545 08/23/18 0717  CKTOTAL >50,000* >50,000* >50,000* >50,000* >50,000*  CKMB 16.6* 19.6* 17.2* 16.9* 13.7*   BNP (last 3 results) No results for input(s): PROBNP in the last 8760 hours. HbA1C: Recent Labs    08/21/18 0647  HGBA1C 6.1*   CBG: No results for input(s): GLUCAP in the last 168 hours. Lipid Profile: No results for input(s): CHOL, HDL, LDLCALC, TRIG,  CHOLHDL, LDLDIRECT in the last 72 hours. Thyroid Function Tests: No results for input(s): TSH, T4TOTAL, FREET4, T3FREE, THYROIDAB in the last 72 hours. Anemia Panel: No results for input(s): VITAMINB12, FOLATE, FERRITIN, TIBC, IRON, RETICCTPCT in the last 72 hours. Urine analysis:    Component Value Date/Time   COLORURINE AMBER (A) 08/17/2018 1210   APPEARANCEUR CLEAR 08/17/2018 1210   LABSPEC 1.011 08/17/2018 1210   PHURINE 7.0 08/17/2018 1210   GLUCOSEU NEGATIVE 08/17/2018 1210   HGBUR LARGE (A) 08/17/2018 1210   BILIRUBINUR NEGATIVE 08/17/2018 1210   KETONESUR NEGATIVE 08/17/2018 1210   PROTEINUR 100 (A) 08/17/2018 1210   NITRITE NEGATIVE 08/17/2018 1210   LEUKOCYTESUR NEGATIVE 08/17/2018 1210   Sepsis Labs: @LABRCNTIP (procalcitonin:4,lacticidven:4)  )No results found for this or any previous visit (from the past 240 hour(s)).    Radiology Studies: No results found.   Scheduled Meds: . polyethylene glycol  17 g Oral BID  . senna-docusate  1 tablet Oral BID    Continuous Infusions: . sodium chloride 150 mL/hr at 08/23/18 0517  .  sodium bicarbonate  infusion 1000 mL 50 mL/hr at 08/22/18 2346     LOS: 7 days   Time Spent in minutes   30 minutes  Payten Beaumier D.O. on 08/23/2018 at 2:07 PM  Between 7am to 7pm - Please see pager noted on [email or web address]  After 7pm go to [email or web address]  And look for the night coverage person covering for me after hours  [facility] Office  [phone]

## 2018-08-23 NOTE — Plan of Care (Signed)
  Problem: Clinical Measurements: Goal: Diagnostic test results will improve Note:  CK and CKMB being monitored and looking for results to trend down;and pt. aware.

## 2018-08-24 LAB — COMPREHENSIVE METABOLIC PANEL
ALT: 109 U/L — ABNORMAL HIGH (ref 0–44)
AST: 399 U/L — ABNORMAL HIGH (ref 15–41)
Albumin: 2.5 g/dL — ABNORMAL LOW (ref 3.5–5.0)
Alkaline Phosphatase: 30 U/L — ABNORMAL LOW (ref 38–126)
Anion gap: 7 (ref 5–15)
BUN: 10 mg/dL (ref 6–20)
CO2: 28 mmol/L (ref 22–32)
Calcium: 8.4 mg/dL — ABNORMAL LOW (ref 8.9–10.3)
Chloride: 104 mmol/L (ref 98–111)
Creatinine, Ser: 0.85 mg/dL (ref 0.61–1.24)
GFR calc Af Amer: >60 mL/min (ref >60)
GFR calc non Af Amer: >60 mL/min (ref >60)
Glucose, Bld: 91 mg/dL (ref 70–99)
Potassium: 4.2 mmol/L (ref 3.5–5.1)
Sodium: 139 mmol/L (ref 135–145)
Total Bilirubin: 0.4 mg/dL (ref 0.3–1.2)
Total Protein: 5 g/dL — ABNORMAL LOW (ref 6.5–8.1)

## 2018-08-24 LAB — CK TOTAL AND CKMB (NOT AT ARMC)
CK, MB: 19.4 ng/mL — ABNORMAL HIGH (ref 0.5–5.0)
Total CK: >50000 U/L — ABNORMAL HIGH (ref 49–397)

## 2018-08-24 MED ORDER — NICOTINE 21 MG/24HR TD PT24
21.0000 mg | MEDICATED_PATCH | Freq: Every day | TRANSDERMAL | Status: DC
  Filled 2018-08-24: qty 1

## 2018-08-24 NOTE — Progress Notes (Signed)
PROGRESS NOTE    Paul Rios  OZH:[id number] DOB: [birth year] DOA: 08/16/2018 PCP: Patient, No Pcp Per   Brief Narrative:  HPI on 08/16/2018 by Dr. Eldridge Daceachel David Paul SignsStephen L Conrad is a [age] y.o. male with no medical history except mild hypertension who comes in with 1 to 2 days of pretty bad body aches and pain.  He went to urgent care yesterday he was told he had influenza although they did not do any labs or check him for the flu and sent him home on an albuterol inhaler.  He was wheezing.  He has no history of wheezing in the past.  He had been coughing some.  He denies any high fevers at home he denies any fevers or chills.  He denies any nausea vomiting diarrhea.  He denies any new medications.  He denies any recent traumatic injury or increase in physical activity.  He does not lift weights.  He denies any usage of any illegal drugs such as methamphetamine or cocaine.  He denies any rashes he denies any joint pains.  He has noticed that his urine is darker and more reddish in color today.  Patient has no history of rheumatological disorders in his past.  He denies any rashes.  Today's found to have a CPK level of over 50,000 undetectable with normal BUN and creatinine with mildly bumped LFTs.  He is referred for admission for rhabdomyolysis.  He is received 2 L of IV fluids and is still complaining of a lot of body aches.  Interim history Patient admitted with acute rhabdomyolysis likely secondary to influenza B.  Continues to receive large amounts of IV fluids, however CK level sstill >50,000. Assessment & Plan   Acute rhabdomyolysis secondary to influenza B -Patient had no history of seizure, strenuous exercise, prolonged immobility, drug abuse.  He has been taking over-the-counter Tylenol Cold and flu, NyQuil (generic brands) -UA was cloudy appearance with small bilirubin, brown color urine, large hemoglobin, 15 ketones, trace leukocytes,>300 protein, rare bacteria -CK> 50,000 -Continue IV  fluids and pain control- 150cc NS, 50cc sodium bicarb -Continue to monitor renal function, currently stable -urine appearance improving  Influenza B -Patient started Tamiflu and is completed course -Continue supportive care with antiemetics and fluid resuscitation  Proteinuria -Likely secondary to the above, continue IV hydration  Hyperglycemia -Hemoglobin A1c 6.1 -Continue to monitor CBG, insulin sliding scale  Hypokalemia -Resolved with replacement, continue to monitor  Hypophosphatemia -Resolved with replacement, continue to monitor  Abnormal LFTs -Suspect secondary to rhabdo -AST and ALT trending downward -PT/INR checked within normal limits -Acute hepatitis panel unremarkable -Right upper quadrant ultrasound showed increased heterogeneous echotexture in the liver without a focal mass is not consistent but may represent hepatic steatosis.  No other abnormalities -Case was discussed with gastroenterology by previous hospitalist, Dr. Rhea BeltonPyrtle felt this was due to muscular cause of abnormal LFTs and recommended continuing IV fluids  Obesity -BMI 38.63 -Patient to discuss lifestyle modifications with PCP on discharge  Constipation -Continue bowel regimen  DVT Prophylaxis  SCDs  Code Status: Full  Family Communication: None at bedside  Disposition Plan: Admitted. Dispo home when CK levels improved  Consultants None   Procedures  RUQ  Antibiotics   Anti-infectives (From admission, onward)   Start     Dose/Rate Route Frequency Ordered Stop   08/17/18 1000  oseltamivir (TAMIFLU) capsule 75 mg     75 mg Oral 2 times daily 08/17/18 0818 08/21/18 2208      Subjective:  Paul Rios seen and examined today.  Would like to go home. Denies current chest pain, shortness of breath, abdominal pain, N/V, dizziness, headache.  Denies muscle stiffness, but does have some soreness.  Objective:   Vitals:   08/23/18 2031 08/23/18 2038 08/24/18 0700 08/24/18 0855  BP:  (!) 160/84  (!) 155/79 140/84  Pulse:  61 (!) 59 (!) 56  Resp:  18 17 20   Temp:  98.8 F (37.1 C) 98.2 F (36.8 C)   TempSrc:  Oral Oral   SpO2:  99% 99% 98%  Weight:  132.6 kg    Height:        Intake/Output Summary (Last 24 hours) at 08/24/2018 1057 Last data filed at 08/24/2018 0600 Gross per 24 hour  Intake 3940 ml  Output 3400 ml  Net 540 ml   Filed Weights   08/21/18 1941 08/23/18 0541 08/23/18 2038  Weight: 132.8 kg 132.4 kg 132.6 kg   Exam  General: Well developed, well nourished, NAD, appears stated age  HEENT: NCAT, mucous membranes moist.   Neck: Supple  Cardiovascular: S1 S2 auscultated, no rubs, murmurs or gallops. Regular rate and rhythm.  Respiratory: Clear to auscultation bilaterally with equal chest rise  Abdomen: Soft, nontender, nondistended, + bowel sounds  Extremities: warm dry without cyanosis clubbing or edema  Neuro: AAOx3, nonfocal  Skin: Without rashes exudates or nodules  Psych: Normal affect and demeanor with intact judgement and insight  Data Reviewed: I have personally reviewed following labs and imaging studies  CBC: Recent Labs  Lab 08/19/18 0540 08/20/18 0558 08/21/18 0647 08/22/18 0437 08/23/18 0717  WBC 6.4 7.6 7.9 8.3 7.1  NEUTROABS 3.9 4.8 5.2 6.1 4.2  HGB 14.2 14.0 14.3 14.3 14.0  HCT 41.7 41.3 42.7 42.0 41.8  MCV 82.7 83.1 82.9 82.7 83.3  PLT 201 229 272 285 297   Basic Metabolic Panel: Recent Labs  Lab 08/19/18 0540 08/20/18 0558 08/21/18 0647 08/22/18 0437 08/23/18 0717 08/24/18 0651  NA 140 140 140 138 139 139  K 4.5 4.6 4.6 4.0 4.6 4.2  CL 105 104 103 99 102 104  CO2 28 29 29 28 28 28   GLUCOSE 92 108* 108* 131* 88 91  BUN 7 8 10 9 9 10   CREATININE 1.02 0.97 1.01 0.94 0.85 0.85  CALCIUM 8.2* 8.3* 8.3* 8.6* 8.4* 8.4*  MG 2.1 2.1 2.1 2.1 2.1  --   PHOS 2.8 3.1 3.7 3.5 3.9  --    GFR: Estimated Creatinine Clearance: 178.2 mL/min (by C-G formula based on SCr of 0.85 mg/dL). Liver Function  Tests: Recent Labs  Lab 08/20/18 0558 08/21/18 0647 08/22/18 0437 08/23/18 0717 08/24/18 0651  AST 1,010* 942* 753* 503* 399*  ALT 144* 150* 141* 120* 109*  ALKPHOS 29* 30* 34* 28* 30*  BILITOT 0.3 0.7 0.3 0.7 0.4  PROT 5.5* 5.5* 5.6* 5.2* 5.0*  ALBUMIN 2.6* 2.7* 2.6* 2.5* 2.5*   No results for input(s): LIPASE, AMYLASE in the last 168 hours. No results for input(s): AMMONIA in the last 168 hours. Coagulation Profile: Recent Labs  Lab 08/19/18 0924  INR 0.96   Cardiac Enzymes: Recent Labs  Lab 08/21/18 2353 08/22/18 0748 08/22/18 1545 08/23/18 0717 08/24/18 0651  CKTOTAL >50,000* >50,000* >50,000* >50,000* >50,000*  CKMB 19.6* 17.2* 16.9* 13.7* 19.4*   BNP (last 3 results) No results for input(s): PROBNP in the last 8760 hours. HbA1C: No results for input(s): HGBA1C in the last 72 hours. CBG: No results for input(s): GLUCAP in the  last 168 hours. Lipid Profile: No results for input(s): CHOL, HDL, LDLCALC, TRIG, CHOLHDL, LDLDIRECT in the last 72 hours. Thyroid Function Tests: No results for input(s): TSH, T4TOTAL, FREET4, T3FREE, THYROIDAB in the last 72 hours. Anemia Panel: No results for input(s): VITAMINB12, FOLATE, FERRITIN, TIBC, IRON, RETICCTPCT in the last 72 hours. Urine analysis:    Component Value Date/Time   COLORURINE AMBER (A) 08/17/2018 1210   APPEARANCEUR CLEAR 08/17/2018 1210   LABSPEC 1.011 08/17/2018 1210   PHURINE 7.0 08/17/2018 1210   GLUCOSEU NEGATIVE 08/17/2018 1210   HGBUR LARGE (A) 08/17/2018 1210   BILIRUBINUR NEGATIVE 08/17/2018 1210   KETONESUR NEGATIVE 08/17/2018 1210   PROTEINUR 100 (A) 08/17/2018 1210   NITRITE NEGATIVE 08/17/2018 1210   LEUKOCYTESUR NEGATIVE 08/17/2018 1210   Sepsis Labs: @LABRCNTIP (procalcitonin:4,lacticidven:4)  )No results found for this or any previous visit (from the past 240 hour(s)).    Radiology Studies: No results found.   Scheduled Meds: . nicotine  21 mg Transdermal Daily  . polyethylene  glycol  17 g Oral BID  . senna-docusate  1 tablet Oral BID   Continuous Infusions: . sodium chloride 150 mL/hr at 08/24/18 0312  .  sodium bicarbonate  infusion 1000 mL 50 mL/hr at 08/23/18 1851     LOS: 8 days   Time Spent in minutes   30 minutes  Eason Housman D.O. on 08/24/2018 at 10:57 AM  Between 7am to 7pm - Please see pager noted on [email or web address]  After 7pm go to [email or web address]  And look for the night coverage person covering for me after hours  [facility] Office  [phone]

## 2018-08-25 LAB — COMPREHENSIVE METABOLIC PANEL
ALT: 99 U/L — ABNORMAL HIGH (ref 0–44)
AST: 292 U/L — ABNORMAL HIGH (ref 15–41)
Albumin: 2.6 g/dL — ABNORMAL LOW (ref 3.5–5.0)
Alkaline Phosphatase: 29 U/L — ABNORMAL LOW (ref 38–126)
Anion gap: 8 (ref 5–15)
BUN: 10 mg/dL (ref 6–20)
CO2: 28 mmol/L (ref 22–32)
Calcium: 8.5 mg/dL — ABNORMAL LOW (ref 8.9–10.3)
Chloride: 105 mmol/L (ref 98–111)
Creatinine, Ser: 0.9 mg/dL (ref 0.61–1.24)
GFR calc Af Amer: >60 mL/min (ref >60)
GLUCOSE: 86 mg/dL (ref 70–99)
Potassium: 3.9 mmol/L (ref 3.5–5.1)
Sodium: 141 mmol/L (ref 135–145)
Total Bilirubin: 0.7 mg/dL (ref 0.3–1.2)
Total Protein: 5.6 g/dL — ABNORMAL LOW (ref 6.5–8.1)

## 2018-08-25 LAB — CK TOTAL AND CKMB (NOT AT ARMC)
CK, MB: 15.7 ng/mL — ABNORMAL HIGH (ref 0.5–5.0)
RELATIVE INDEX: 0 (ref 0.0–2.5)
Total CK: 33062 U/L — ABNORMAL HIGH (ref 49–397)

## 2018-08-25 NOTE — Discharge Summary (Addendum)
Physician Discharge Summary  Paul Rios KMM:[id number] DOB: [birth year] DOA: 08/16/2018  PCP: Patient, No Pcp Per  Admit date: 08/16/2018 Discharge date: 08/25/2018  Time spent: 45 minutes  Recommendations for Outpatient Follow-up:  Patient left against medical advice.   Discharge Diagnoses:  Principal Problem: Acute rhabdomyolysis secondary to influenza B Influenza B Proteinuria Hyperglycemia Hypokalemia Hypophosphatemia Abnormal LFTs Obesity Constipation  Discharge Condition:   Diet recommendation:   Filed Weights   08/23/18 0541 08/23/18 2038 08/24/18 2037  Weight: 132.4 kg 132.6 kg 134.4 kg    History of present illness:  on 08/16/2018 by Dr. Lambert Mody Statonis a 33 y.o.malewithnomedical historyexcept mild hypertension who comes in with 1 to 2 days of pretty bad body aches and pain. He went to urgent care yesterday he was told he had influenza although they did not do any labs or check him for the flu and sent him home on an albuterol inhaler. He was wheezing. He has no history of wheezing in the past. He had been coughing some. He denies any high fevers at home he denies any fevers or chills. He denies any nausea vomiting diarrhea. He denies any new medications. He denies any recent traumatic injury or increase in physical activity. He does not lift weights. He denies any usage of any illegal drugs such as methamphetamine or cocaine. He denies any rashes he denies any joint pains. He has noticed that his urine is darker and more reddish in color today. Patient has no history of rheumatological disorders in his past. He denies any rashes. Today's found to have a CPK level of over 50,000 undetectable with normal BUN and creatinine with mildly bumped LFTs. He is referred for admission for rhabdomyolysis. He is received 2 L of IV fluids and is still complaining of a lot of body aches.  Hospital Course:   Patient admitted with acute  rhabdomyolysis likely secondary to influenza B.  Continues to receive large amounts of IV fluids, however CK level still >50,000.  Patient very frustrated and angry. He voices that he would like to leave the floor, go outside, have his family visit. I explained that he could leave his room, leave the floor, however could not leave the hospital as that would be leaving AMA. I explained that his family could visit, however there were advisories in place given the flu season. Explained that I understood and empathized with his frustration of being in the hospital for so long and not being able to move freely. I explained that he needed to be accompanied by floor staff if going outside.   Patient proceeded to yell and get his things together. Pulled out his IV. RN made aware.   Addendum: I called the patient to make him aware that his CK level had come down to 33K. He hung up.  Acute rhabdomyolysis secondary to influenza B -Patient had no history of seizure, strenuous exercise, prolonged immobility, drug abuse.  He has been taking over-the-counter Tylenol Cold and flu, NyQuil (generic brands) -UA was cloudy appearance with small bilirubin, brown color urine, large hemoglobin, 15 ketones, trace leukocytes,>300 protein, rare bacteria -CK> 50,000 -Was placed on IVF 150cc NS, 50cc sodium bicarb -urine appearance improving  -Renal function remained stable  Influenza B -Patient started Tamiflu and is completed course -was placed on supportive care with antiemetics and fluid resuscitation  Proteinuria -Likely secondary to the above, was placed on IV hydration  Hyperglycemia -Hemoglobin A1c 6.1 -was placed on insulin sliding scale during hospitalization  Hypokalemia -Resolved with replacement  Hypophosphatemia -Resolved with replacement  Abnormal LFTs -Suspect secondary to rhabdo -AST and ALT trended downward -PT/INR checked within normal limits -Acute hepatitis panel  unremarkable -Right upper quadrant ultrasound showed increased heterogeneous echotexture in the liver without a focal mass is not consistent but may represent hepatic steatosis.  No other abnormalities -Case was discussed with gastroenterology by previous hospitalist, Dr. Rhea BeltonPyrtle felt this was due to muscular cause of abnormal LFTs and recommended continuing IV fluids  Obesity -BMI 38.63 -Patient to discuss lifestyle modifications with PCP on discharge  Constipation -Continue bowel regimen  Procedures: RUQ  Consultations: None  Discharge Exam: Vitals:   08/24/18 2037 08/25/18 0430  BP: (!) 149/83 131/83  Pulse: 75 (!) 52  Resp: 18 18  Temp: (!) 100.6 F (38.1 C) 97.8 F (36.6 C)  SpO2: 98% 95%     General: Well developed, well nourished, NAD, appears stated age  HEENT: NCAT, mucous membranes moist.  Neuro: AAOx3, nonfocal  Psych: Appropriately frustrated   Discharge Instructions  Allergies as of 08/25/2018      Reactions   Augmentin [amoxicillin-pot Clavulanate] Other (See Comments)   Urinating blood, felt like insides were swelling, severe pain          Allergies  Allergen Reactions  . Augmentin [Amoxicillin-Pot Clavulanate] Other (See Comments)    Urinating blood, felt like insides were swelling, severe pain      The results of significant diagnostics from this hospitalization (including imaging, microbiology, ancillary and laboratory) are listed below for reference.    Significant Diagnostic Studies: [place] Abdomen Limited Ruq  Result Date: 08/17/2018 CLINICAL DATA:  Abnormal LFTs EXAM: ULTRASOUND ABDOMEN LIMITED RIGHT UPPER QUADRANT COMPARISON:  None. FINDINGS: Gallbladder: No gallstones or wall thickening visualized. No sonographic Murphy sign noted by sonographer. Common bile duct: Diameter: 3.3 mm Liver: Heterogeneous increased echotexture with no focal mass. Portal vein is patent on color Doppler imaging with normal direction of blood flow towards  the liver. IMPRESSION: Increased heterogeneous echotexture in the liver without a focal mass is nonspecific but may represent hepatic steatosis. No other abnormalities. Electronically Signed   By: Gerome Samavid  Williams III M.D   On: 08/17/2018 18:33    Microbiology: No results found for this or any previous visit (from the past 240 hour(s)).   Labs: Basic Metabolic Panel: Recent Labs  Lab 08/19/18 0540 08/20/18 0558 08/21/18 [id number] 08/22/18 0437 08/23/18 0717 08/24/18 0651 08/25/18 0728  NA 140 140 140 138 139 139 141  K 4.5 4.6 4.6 4.0 4.6 4.2 3.9  CL 105 104 103 99 102 104 105  CO2 28 29 29 28 28 28 28   GLUCOSE 92 108* 108* 131* 88 91 86  BUN 7 8 10 9 9 10 10   CREATININE 1.02 0.97 1.01 0.94 0.85 0.85 0.90  CALCIUM 8.2* 8.3* 8.3* 8.6* 8.4* 8.4* 8.5*  MG 2.1 2.1 2.1 2.1 2.1  --   --   PHOS 2.8 3.1 3.7 3.5 3.9  --   --    Liver Function Tests: Recent Labs  Lab 08/21/18 0647 08/22/18 0437 08/23/18 0717 08/24/18 0651 08/25/18 0728  AST 942* 753* 503* 399* 292*  ALT 150* 141* 120* 109* 99*  ALKPHOS 30* 34* 28* 30* 29*  BILITOT 0.7 0.3 0.7 0.4 0.7  PROT 5.5* 5.6* 5.2* 5.0* 5.6*  ALBUMIN 2.7* 2.6* 2.5* 2.5* 2.6*   No results for input(s): LIPASE, AMYLASE in the last 168 hours. No results for input(s): AMMONIA in the last 168 hours.  CBC: Recent Labs  Lab 08/19/18 0540 08/20/18 0558 08/21/18 0647 08/22/18 0437 08/23/18 0717  WBC 6.4 7.6 7.9 8.3 7.1  NEUTROABS 3.9 4.8 5.2 6.1 4.2  HGB 14.2 14.0 14.3 14.3 14.0  HCT 41.7 41.3 42.7 42.0 41.8  MCV 82.7 83.1 82.9 82.7 83.3  PLT 201 229 272 285 297   Cardiac Enzymes: Recent Labs  Lab 08/22/18 0748 08/22/18 1545 08/23/18 0717 08/24/18 0651 08/25/18 0728  CKTOTAL >50,000* >50,000* >50,000* >50,000* PENDING  CKMB 17.2* 16.9* 13.7* 19.4* 15.7*   BNP: BNP (last 3 results) No results for input(s): BNP in the last 8760 hours.  ProBNP (last 3 results) No results for input(s): PROBNP in the last 8760 hours.  CBG: No  results for input(s): GLUCAP in the last 168 hours.     Signed:  Edsel PetrinMaryann Emlyn Maves  [facility] 08/25/2018, 9:42 AM

## 2018-08-25 NOTE — Progress Notes (Signed)
Earlier this morning talked to Dr. Catha Gosselin regarding pt leaving the floor and also unhooking the fluids while off the floor, she said he can be off the unit but one of the staff member has to accompany him as his labs especially CK was high, but was ok to unhook the  fluids while he is off the floor.  Didn't get the chance to explain this to patient as this nurse was busy.  After 10 to 15 min Dr. Catha Gosselin came to this nurse and said pt wants to leave AMA.  This nurse took the [facility] form into the room where she saw pt yelling at the doctor while the doctor was trying to explain that he could leave his room, leave the floor accompanied by the staff member, however could not leave the hospital as that would be leaving AMA.  He was very agitated and frustrated and wanted to change his clothes before this nurse can go in and complete the AMA form.  He left the floor without signing AMA form.

## 2018-09-01 NOTE — Progress Notes (Signed)
Patient called Paul Rios nurses station requesting to speak with Charge Nurse. This RN answered his phone call. He stated that the MD stated to get a follow up appointment at [facility]. I informed patient, that normally the hospitalitis did not do follow-up appointments outpatient and that he would have to see his primary care physician for that or whoever he sees for his care. Patient was worried that wherever he went to get an appointment with would not be able to see his chart. I informed patient that his chart would be accessible for review with his permission. Patient understanding of this information and had no further questions. I encouraged him to call back if he had any more.

## 2018-09-28 ENCOUNTER — Encounter (HOSPITAL_BASED_OUTPATIENT_CLINIC_OR_DEPARTMENT_OTHER): Payer: Self-pay

## 2018-09-28 ENCOUNTER — Emergency Department (HOSPITAL_BASED_OUTPATIENT_CLINIC_OR_DEPARTMENT_OTHER): Payer: Medicaid Other

## 2018-09-28 ENCOUNTER — Emergency Department (HOSPITAL_BASED_OUTPATIENT_CLINIC_OR_DEPARTMENT_OTHER)
Admission: EM | Admit: 2018-09-28 | Discharge: 2018-09-28 | Disposition: A | Discharge status: Home / Self Care | Payer: Medicaid Other | Attending: Emergency Medicine | Admitting: Emergency Medicine

## 2018-09-28 ENCOUNTER — Other Ambulatory Visit: Payer: Self-pay

## 2018-09-28 DIAGNOSIS — J029 Acute pharyngitis, unspecified: Secondary | ICD-10-CM | POA: Diagnosis present

## 2018-09-28 DIAGNOSIS — F17210 Nicotine dependence, cigarettes, uncomplicated: Secondary | ICD-10-CM | POA: Diagnosis not present

## 2018-09-28 DIAGNOSIS — J020 Streptococcal pharyngitis: Secondary | ICD-10-CM | POA: Diagnosis not present

## 2018-09-28 DIAGNOSIS — I10 Essential (primary) hypertension: Secondary | ICD-10-CM | POA: Diagnosis not present

## 2018-09-28 LAB — CBC WITH DIFFERENTIAL/PLATELET
Abs Immature Granulocytes: 0.08 10*3/uL — ABNORMAL HIGH (ref 0.00–0.07)
Basophils Absolute: 0.1 10*3/uL (ref 0.0–0.1)
Basophils Relative: 0 %
Eosinophils Absolute: 0.2 10*3/uL (ref 0.0–0.5)
Eosinophils Relative: 1 %
HCT: 45 % (ref 39.0–52.0)
HEMOGLOBIN: 14.3 g/dL (ref 13.0–17.0)
Immature Granulocytes: 1 %
LYMPHS PCT: 22 %
Lymphs Abs: 3 10*3/uL (ref 0.7–4.0)
MCH: 27.8 pg (ref 26.0–34.0)
MCHC: 31.8 g/dL (ref 30.0–36.0)
MCV: 87.5 fL (ref 80.0–100.0)
Monocytes Absolute: 1.1 10*3/uL — ABNORMAL HIGH (ref 0.1–1.0)
Monocytes Relative: 8 %
NEUTROS ABS: 9.4 10*3/uL — AB (ref 1.7–7.7)
Neutrophils Relative %: 68 %
Platelets: 214 10*3/uL (ref 150–400)
RBC: 5.14 MIL/uL (ref 4.22–5.81)
RDW: 14.7 % (ref 11.5–15.5)
WBC: 13.7 10*3/uL — ABNORMAL HIGH (ref 4.0–10.5)
nRBC: 0 % (ref 0.0–0.2)

## 2018-09-28 LAB — BASIC METABOLIC PANEL
Anion gap: 6 (ref 5–15)
BUN: 11 mg/dL (ref 6–20)
CO2: 28 mmol/L (ref 22–32)
Calcium: 8.4 mg/dL — ABNORMAL LOW (ref 8.9–10.3)
Chloride: 103 mmol/L (ref 98–111)
Creatinine, Ser: 1.08 mg/dL (ref 0.61–1.24)
GFR calc non Af Amer: >60 mL/min (ref >60)
Glucose, Bld: 93 mg/dL (ref 70–99)
Potassium: 3.5 mmol/L (ref 3.5–5.1)
Sodium: 137 mmol/L (ref 135–145)

## 2018-09-28 MED ORDER — LIDOCAINE VISCOUS HCL 2 % MT SOLN: 15.0000 mL | OROMUCOSAL | 0 refills | Status: AC | PRN

## 2018-09-28 MED ORDER — CLINDAMYCIN HCL 150 MG PO CAPS
300.0000 mg | ORAL_CAPSULE | Freq: Once | ORAL | Status: AC
  Administered 2018-09-28: 300 mg via ORAL
  Filled 2018-09-28: qty 2

## 2018-09-28 MED ORDER — IOHEXOL 300 MG/ML  SOLN
100.0000 mL | Freq: Once | INTRAMUSCULAR | Status: AC | PRN
  Administered 2018-09-28: 100 mL via INTRAVENOUS

## 2018-09-28 MED ORDER — DEXAMETHASONE SODIUM PHOSPHATE 10 MG/ML IJ SOLN
10.0000 mg | Freq: Once | INTRAMUSCULAR | Status: AC
  Administered 2018-09-28: 10 mg via INTRAVENOUS
  Filled 2018-09-28: qty 1

## 2018-09-28 MED ORDER — KETOROLAC TROMETHAMINE 15 MG/ML IJ SOLN
15.0000 mg | Freq: Once | INTRAMUSCULAR | Status: DC
  Filled 2018-09-28: qty 1

## 2018-09-28 MED ORDER — KETOROLAC TROMETHAMINE 15 MG/ML IJ SOLN
15.0000 mg | Freq: Once | INTRAMUSCULAR | Status: AC
  Administered 2018-09-28: 15 mg via INTRAVENOUS
  Filled 2018-09-28: qty 1

## 2018-09-28 MED ORDER — KETOROLAC TROMETHAMINE 30 MG/ML IJ SOLN
15.0000 mg | Freq: Once | INTRAMUSCULAR | Status: AC
  Administered 2018-09-28: 15 mg via INTRAMUSCULAR

## 2018-09-28 NOTE — Discharge Instructions (Addendum)
Evaluated today for strep throat.  Your test was positive.  You may have developing abscess that is not able to be drained.  Please continue to take the clindamycin your previously prescribed.  Follow Up with ENT for evaluation tomorrow.  Return to the ED for any new or worsening symptoms.

## 2018-09-28 NOTE — ED Provider Notes (Signed)
[facility] EMERGENCY DEPARTMENT Provider Note   CSN: [id number] Arrival date & time: 09/28/18  1612  History   Chief Complaint Chief Complaint  Patient presents with   Sore Throat    HPI Paul Rios is a 33 y.o. male with past medical history for obesity, hypokalemia, hypertension, chronic proteinuria who presents for evaluation of sore throat.  Patient states he was diagnosed with strep pharyngitis 2 days ago at [facility].  Patient states he was given a steroid shot as well as a prescription for antibiotics at that time.  Patient states he was unable to fill his prescription up until 12 PM this afternoon.  Patient was given clindamycin.  Patient states he has had increased pain, more so to the right side of his throat.  Patient states that he has difficulty swallowing sold foods secondary to pain.  Unable to tolerate p.o. liquids without difficulty.  He rates his pain a 10/10.  Pain located primarily to right throat as well as posterior tongue.  Denies drooling, sinus, phonation changes.  Has not taken anything for his pain PTA.  Denies additional aggravating or alleviating factors.  Denies fever, chills, nausea, vomiting, chest pain, shortness of breath, neck stiffness, neck rigidity, cough. Pain is constant in nature since onset.  History obtained from patient.  No interpreter was used.   HPI  Past Medical History:  Diagnosis Date   Hypertension     Patient Active Problem List   Diagnosis Date Noted   Proteinuria 08/17/2018   Influenza B 08/17/2018   Hypokalemia 08/17/2018   Obesity (BMI 30-39.9) 08/17/2018   Rhabdomyolysis 08/16/2018    Past Surgical History:  Procedure Laterality Date   INCISE AND DRAIN ABCESS     in mouth, 2015   LUMBAR LAMINECTOMY/DECOMPRESSION MICRODISCECTOMY Left 03/22/2018   Procedure: LEFT-SIDED LUMBAR 5-SACRUM 1 MICRODISECTOMY;  Surgeon: Estill Bamberg, MD;  Location: [facility] OR;  Service: Orthopedics;   Laterality: Left;        Home Medications    Prior to Admission medications   Medication Sig Start Date End Date Taking? Authorizing Provider  acetaminophen (TYLENOL) 500 MG tablet Take 1,000 mg by mouth every 6 (six) hours as needed for mild pain.    [provider]  albuterol (PROVENTIL HFA;VENTOLIN HFA) 108 (90 Base) MCG/ACT inhaler Inhale 2 puffs into the lungs every 4 (four) hours as needed for wheezing. 08/15/18   [provider]  clindamycin (CLEOCIN) 150 MG capsule Take 3 capsules (450 mg total) by mouth 3 (three) times daily. Patient not taking: Reported on 08/17/2018 03/28/18   Hedges, Tinnie Gens, PA-C  lidocaine (XYLOCAINE) 2 % solution Use as directed 15 mLs in the mouth or throat as needed for mouth pain. 09/28/18   Reniya Mcclees A, PA-C  Phenyleph-Doxylamine-DM-APAP (NYQUIL SEVERE+ VAPOCOOL) 5-6.25-10-325 MG/15ML LIQD Take 10 mLs by mouth every 4 (four) hours as needed (cold symptoms).    [provider]  Pseudoephedrine-APAP-DM (TYLENOL COLD/FLU SEVERE DAY PO) Take 2 capsules by mouth every 6 (six) hours as needed (cough).    [provider]    Family History No family history on file.  Social History Social History   Tobacco Use   Smoking status: Current Every Day Smoker    Packs/day: 0.50    Types: Cigarettes   Smokeless tobacco: Never Used  Substance Use Topics   Alcohol use: Never    Frequency: Never   Drug use: Yes    Types: Marijuana  Allergies   Augmentin [amoxicillin-pot clavulanate]   Review of Systems Review of Systems  Constitutional: Negative.   HENT: Positive for ear pain and sore throat. Negative for congestion, dental problem, drooling, facial swelling, mouth sores, nosebleeds, postnasal drip, rhinorrhea, sinus pressure, sinus pain, sneezing, tinnitus, trouble swallowing and voice change.   Eyes: Negative.   Respiratory: Negative.   Cardiovascular: Negative.   Gastrointestinal: Negative.     Genitourinary: Negative.   Musculoskeletal: Negative.   Skin: Negative.   All other systems reviewed and are negative.    Physical Exam Updated Vital Signs BP 126/77 (BP Location: Right Arm)    Pulse (!) 59    Temp 98.6 F (37 C) (Oral)    Resp 16    Ht  (1.88 m)    Wt 131.1 kg    SpO2 99%    BMI 37.11 kg/m   Physical Exam Vitals signs and nursing note reviewed.  Constitutional:      General: He is not in acute distress.    Appearance: He is well-developed. He is not ill-appearing, toxic-appearing or diaphoretic.  HENT:     Head: Normocephalic and atraumatic.     Right Ear: Tympanic membrane and ear canal normal. No drainage, swelling or tenderness. No middle ear effusion. Tympanic membrane is not erythematous.     Left Ear: Tympanic membrane and ear canal normal. No drainage, swelling or tenderness.  No middle ear effusion. Tympanic membrane is not erythematous.     Nose: No congestion or rhinorrhea.     Mouth/Throat:     Comments: Posterior oropharynx erythematous without exudate.  Tonsils 1+ on left, 2+ on right.  Does not cross midline.  Bilateral tonsils erythematous with exudate.  Membranes moist.  No evidence of soft palate elevation.  No submandibular swelling.  Uvula midline without deviation.  No drooling, trismus or phonation changes. Eyes:     Conjunctiva/sclera: Conjunctivae normal.     Pupils: Pupils are equal, round, and reactive to light.  Neck:     Musculoskeletal: Normal range of motion and neck supple.     Comments: No neck stiffness or neck rigidity.  No meningismus.  Mild cervical lymphadenopathy. Cardiovascular:     Rate and Rhythm: Normal rate and regular rhythm.     Heart sounds: Normal heart sounds.  Pulmonary:     Effort: Pulmonary effort is normal. No respiratory distress.     Comments: Clear to auscultation bilateral without wheeze, rhonchi or rales.  No stridor.  Speaking full sentences without difficulty.  No accessory muscle usage. Abdominal:      General: There is no distension.     Palpations: Abdomen is soft.     Comments: Soft, Nontender without rebound or guarding.  Musculoskeletal: Normal range of motion.  Skin:    General: Skin is warm and dry.  Neurological:     Mental Status: He is alert.    ED Treatments / Results  Labs (all labs ordered are listed, but only abnormal results are displayed) Labs Reviewed  CBC WITH DIFFERENTIAL/PLATELET - Abnormal; Notable for the following components:      Result Value   WBC 13.7 (*)    Neutro Abs 9.4 (*)    Monocytes Absolute 1.1 (*)    Abs Immature Granulocytes 0.08 (*)    All other components within normal limits  BASIC METABOLIC PANEL - Abnormal; Notable for the following components:   Calcium 8.4 (*)    All other components within normal limits    EKG  None  Radiology Ct Soft Tissue Neck W Contrast  Result Date: 09/28/2018 CLINICAL DATA:  Worsening sore throat, began antibiotics today. EXAM: CT NECK WITH CONTRAST TECHNIQUE: Multidetector CT imaging of the neck was performed using the standard protocol following the bolus administration of intravenous contrast. CONTRAST:  OMNIPAQUE IOHEXOL 300 MG/ML  SOLN COMPARISON:  None. FINDINGS: PHARYNX AND LARYNX: Large adenoids and RIGHT greater LEFT palatine tonsils. Superimposed 7 x 10 x 13 mm tonsillar versus peritonsillar abscess with RIGHT parapharyngeal fat stranding. Edema extending to RIGHT glossoepiglottic fold. Normal epiglottis. Normal larynx. SALIVARY GLANDS: Normal. THYROID: Normal. LYMPH NODES: RIGHT > LEFT cervical lymphadenopathy, 17 mm short axis RIGHT level IIa reniform lymph node with homogeneous enhancement. VASCULAR: Normal. LIMITED INTRACRANIAL: Normal. VISUALIZED ORBITS: Normal. MASTOIDS AND VISUALIZED PARANASAL SINUSES: Lobulated maxillary sinus mucosal thickening. SKELETON: Large RIGHT maxillary mucosal retention cyst. Congenital cervical spinal canal narrowing. Cervical levoscoliosis could be positional.  UPPER CHEST: Lung apices are clear. No superior mediastinal lymphadenopathy. OTHER: None. IMPRESSION: 1. Acute tonsillitis with 7 x 10 x 13 mm RIGHT tonsillar versus peritonsillar abscess. Patent airway. 2. Reactive RIGHT cervical lymphadenopathy. Electronically Signed   By: Awilda Metro M.D.   On: 09/28/2018 18:08    Procedures Procedures (including critical care time)  Medications Ordered in ED Medications  ketorolac (TORADOL) 15 MG/ML injection 15 mg (15 mg Intravenous Not Given 09/28/18 1952)  ketorolac (TORADOL) 30 MG/ML injection 15 mg (has no administration in time range)  dexamethasone (DECADRON) injection 10 mg (10 mg Intravenous Given 09/28/18 1652)  ketorolac (TORADOL) 15 MG/ML injection 15 mg (15 mg Intravenous Given 09/28/18 1734)  iohexol (OMNIPAQUE) 300 MG/ML solution 100 mL (100 mLs Intravenous Contrast Given 09/28/18 1740)  clindamycin (CLEOCIN) capsule 300 mg (300 mg Oral Given 09/28/18 1944)     Initial Impression / Assessment and Plan / ED Course  I have reviewed the triage vital signs and the nursing notes.  Pertinent labs & imaging results that were available during my care of the patient were reviewed by me and considered in my medical decision making (see chart for details).  33 year old male who appears otherwise well presents for evaluation of sore throat.  Afebrile, nonseptic, non-ill-appearing.  Diagnosis pharyngitis 2 days ago, however was unable to obtain antibiotics at that time secondary to financial restraints.  Patient did pick up prescription for this this afternoon.  Patient with increased pain to right tonsillar area.  Tonsils 1+ on left 2+ on right.  Does not cross midline.  Erythema and exudate to bilateral tonsils.  No drooling, trismus or phonation changes.  Neck without stiffness or rigidity, no meningismus, low suspicion for meningitis.  Bilateral ears without evidence of otitis.  No facial swelling. Given asymmetric tonsils will obtain CT to r/o PTA  or RPA.  Patient given steroids and Toradol for relief of symptoms.  CBC mild leukocytosis at 13.7, however was recently given steroids. Metabolic panel without electrolyte, renal or liver abnormality.  Patient able to tolerate p.o. intake in department without difficulty.  CT consistent with tonsillitis versus early peritonsillar abscess.  Will have patient continue on clindamycin as well as viscous lidocaine.  Discussed follow-up with ENT tomorrow morning.  Patient without acute signs of respiratory distress.  No drooling, dysphasia or trismus.  Able to tolerate p.o. intake without difficulty.  Discussed return precautions which would require them to return to the emergency department.  Patient hemodynamically stable and appropriate for DC home at this time.  Patient voiced understanding of return precautions and  is agreeable for follow-up.     Final Clinical Impressions(s) / ED Diagnoses   Final diagnoses:  Strep pharyngitis    ED Discharge Orders         Ordered    lidocaine (XYLOCAINE) 2 % solution  As needed     09/28/18 1939           Merrie Epler A, PA-C 09/28/18 1953    Arby BarrettePfeiffer, Marcy, MD 09/30/18 1316

## 2018-09-28 NOTE — ED Notes (Signed)
Patient transported to CT 

## 2018-09-28 NOTE — ED Triage Notes (Addendum)
Pt c/o worse sore throat-was dx with step at [facility] ED 2 days-was unable to afford rx and started today-NAD-steady gait

## 2019-05-14 ENCOUNTER — Encounter: Payer: Self-pay | Admitting: Physical Therapy

## 2019-05-14 ENCOUNTER — Other Ambulatory Visit: Payer: Self-pay

## 2019-05-14 ENCOUNTER — Ambulatory Visit: Payer: Medicaid Other | Attending: Orthopedic Surgery | Admitting: Physical Therapy

## 2019-05-14 DIAGNOSIS — M5441 Lumbago with sciatica, right side: Secondary | ICD-10-CM | POA: Insufficient documentation

## 2019-05-14 DIAGNOSIS — R2689 Other abnormalities of gait and mobility: Secondary | ICD-10-CM | POA: Insufficient documentation

## 2019-05-14 DIAGNOSIS — R262 Difficulty in walking, not elsewhere classified: Secondary | ICD-10-CM | POA: Insufficient documentation

## 2019-05-14 DIAGNOSIS — M6281 Muscle weakness (generalized): Secondary | ICD-10-CM | POA: Diagnosis present

## 2019-05-14 NOTE — Therapy (Addendum)
[facility] [address]  [place], [place], [address] Phone: [phone]   Fax:  [phone]  Physical Therapy Evaluation  Patient Details  Name: Paul Rios MRN: [id number] Date of Birth: 04-01-1986 Referring Provider (PT): Phylliss Bob, MD   Encounter Date: 05/14/2019  PT End of Session - 05/14/19 1814    Visit Number  1    Number of Visits  4    Date for PT Re-Evaluation  06/04/19    Authorization Type  Medicaid    PT Start Time  1321   pt late   PT Stop Time  1404    PT Time Calculation (min)  43 min    Activity Tolerance  Patient limited by pain    Behavior During Therapy  [facility] for tasks assessed/performed       Past Medical History:  Diagnosis Date  . Hypertension     Past Surgical History:  Procedure Laterality Date  . INCISE AND DRAIN ABCESS     in mouth, 2015  . LUMBAR LAMINECTOMY/DECOMPRESSION MICRODISCECTOMY Left 03/22/2018   Procedure: LEFT-SIDED LUMBAR 5-SACRUM 1 MICRODISECTOMY;  Surgeon: Phylliss Bob, MD;  Location: [place];  Service: Orthopedics;  Laterality: Left;    There were no vitals filed for this visit.   Subjective Assessment - 05/14/19 1322    Subjective  Patient reports LBP for the past 3 years. Has been getting facet injections and ultimately underwent L L5-S1 laminectomy/decompression in 2019. Also suffered from rhabdomyolysis in February.  Since then, the pain has been overall worse, with day-to-day fluctuations. Pain is located from central LB down R buttock and leg, ending behind R knee cap. N/T radiates down anterior leg to toes. Denies recent B&B changes or saddle anesthesia, but does have shooting pain into testicles. Worse with standing and sitting for a few minutes, turning and twisting, transitions. Better with hot shower. Notes that he has to catch himself from falling daily d/t buckling in R LE.    Pertinent History  HTN, L L5-S1 lami/decompression 2019     Limitations  Sitting;Lifting;Standing;Walking;House hold activities    How long can you sit comfortably?  5-10 min    How long can you stand comfortably?  5-10 min    How long can you walk comfortably?  0 min    Diagnostic tests  04/10/19 lumbar xray: mild-mod height loss at L5-S1 with mild loss of lordosis, no acute abnormalities    Patient Stated Goals  get rid of pain    Currently in Pain?  Yes    Pain Score  4     Pain Location  Back    Pain Orientation  Right    Pain Descriptors / Indicators  Dull    Pain Type  Chronic pain         [facility] PT Assessment - 05/14/19 1331      Assessment   Medical Diagnosis  Lumbar radiculopathy    Referring Provider (PT)  Phylliss Bob, MD    Onset Date/Surgical Date  05/13/16    Next MD Visit  not sure    Prior Therapy  no      Precautions   Precautions  None      Balance Screen   Has the patient fallen in the past 6 months  No    Has the patient had a decrease in activity level because of a fear of falling?   No    Is the patient reluctant to  leave their home because of a fear of falling?   No      Home Social worker  Private residence    Living Arrangements  Alone;Spouse/significant other    Available Help at Discharge  Family    Type of [place] Access  Level entry    [place]  None      Prior Function   Level of Luce  Unemployed   cannot work d/t LBP   Vocation Requirements  previously required walking at work    Leisure  caring for his child      Cognition   Overall Cognitive Status  Within Functional Limits for tasks assessed      Sensation   Light Touch  Appears Intact      Coordination   Gross Motor Movements are Fluid and Coordinated  Yes      Posture/Postural Control   Posture/Postural Control  Postural limitations    Postural Limitations  Forward head;Posterior pelvic tilt;Weight shift left      ROM / Strength    AROM / PROM / Strength  AROM;Strength      AROM   AROM Assessment Site  Lumbar    Lumbar Flexion  mid shin   8/10   Lumbar Extension  moderately limited   8/10 pain   Lumbar - Right Side Bend  jt line   8/10   Lumbar - Left Side Bend  jt line   8/10   Lumbar - Right Rotation  moderately limited   8/10 pain   Lumbar - Left Rotation  moderately limited   8/10 pain     Strength   Strength Assessment Site  Hip;Knee;Ankle    Right/Left Hip  Right;Left    Right Hip Flexion  4+/5   pain   Right Hip ABduction  4+/5    Right Hip ADduction  4/5    Left Hip Flexion  4+/5    Left Hip ABduction  4+/5    Left Hip ADduction  4/5    Right/Left Knee  Right;Left    Right Knee Flexion  4/5    Right Knee Extension  4/5    Left Knee Flexion  4+/5    Left Knee Extension  4/5    Right/Left Ankle  Right;Left    Right Ankle Dorsiflexion  4-/5    Right Ankle Plantar Flexion  3+/5    Left Ankle Dorsiflexion  4+/5    Left Ankle Plantar Flexion  4+/5      Palpation   Palpation comment  increased tone in R QL, lumbar paraspinals, over L5 incision, over proximal glutes and R PSIS      Ambulation/Gait   Assistive device  None    Gait Pattern  Step-to pattern;Step-through pattern;Antalgic;Decreased stance time - right;Decreased step length - left;Lateral trunk lean to right;Decreased weight shift to right;Lateral trunk lean to left;Trunk flexed    Ambulation Surface  Level;Indoor    Gait velocity  decreased                Objective measurements completed on examination: See above findings.      [facility] Adult PT Treatment/Exercise - 05/14/19 1331      Modalities   Modalities  Electrical Stimulation;Moist Heat      Moist Heat Therapy   Number Minutes Moist Heat  10 Minutes    Moist Heat  Location  Lumbar Spine      Electrical Stimulation   Electrical Stimulation Location  R LB    Electrical Stimulation Action  IFC    Electrical Stimulation Parameters  80-150 hz; output to tol; 10  min    Electrical Stimulation Goals  Pain             PT Education - 05/14/19 1813    Education Details  prognosis, POC, HEP; edu on use of TENS to LB for pain-relief    Person(s) Educated  Patient    Methods  Explanation;Demonstration;Tactile cues;Verbal cues;Handout    Comprehension  Verbalized understanding;Returned demonstration       PT Short Term Goals - 05/14/19 1827      PT SHORT TERM GOAL #1   Title  Patient to be independent with initial HEP.    Time  1    Period  Weeks    Status  New    Target Date  05/21/19        PT Long Term Goals - 05/14/19 1827      PT LONG TERM GOAL #1   Title  Patient to be independent with advanced HEP.    Time  3    Period  Weeks    Status  New    Target Date  06/04/19      PT LONG TERM GOAL #2   Title  Patient to demonstrate lumbar AROM WFL and with pain <3/10.    Time  3    Period  Weeks    Status  New    Target Date  06/04/19      PT LONG TERM GOAL #3   Title  Patient to report tolerance of 30 min of sitting and standing without pain limting.    Time  3    Period  Weeks    Status  New    Target Date  06/04/19      PT LONG TERM GOAL #4   Title  Patient to report 80% improvement in pain with STS transitions.    Time  3    Period  Weeks    Status  New    Target Date  06/04/19      PT LONG TERM GOAL #5   Title  Patient to report 90% improvement in frequency of R knee buckling.    Time  3    Period  Weeks    Status  New    Target Date  06/04/19      Additional Long Term Goals   Additional Long Term Goals  Yes      PT LONG TERM GOAL #6   Title  Patient to demonstrate B LE strength >=4+/5.    Time  3    Period  Weeks    Status  New    Target Date  06/04/19             Plan - 05/14/19 1816    Clinical Impression Statement  Patient is a 33y/o M presenting to O[facility]T with c/o acute on chronic midline LBP since suffering from rhabdomyolysis in February. Patient previously underwent L L5-S1  laminectomy/decompression in 2019. Pain is located in midline of LB with radiation down R buttock and leg, ending behind R knee cap. N/T radiates down anterior leg to toes. Denies recent B&B changes or saddle anesthesia, but does have shooting pain into groin. Pain worse with standing and sitting for a few minutes, turning and twisting, and transitions. Notes  that he has to catch himself from falling daily d/t buckling in R LE. Patient today with limited and very painful lumbar AROM, decreased R LE strength, very limited positional tolerance, increased tone in R QL and lumbar paraspinals, TTP over surgical scar, R proximal glutes and R PSIS, and gait deviations. Ended session with moist heat and e-stim to R LB for pain relief as patient quite limited by pain with assessment. Plan to administer HEP next session. Patient would benefit from skilled PT services 1x/week for 3 weeks to address aforementioned impairments.    Personal Factors and Comorbidities  Comorbidity 2;Time since onset of injury/illness/exacerbation;Fitness;Past/Current Experience;Profession    Comorbidities  HTN, L L5-S1 lami/decompression 2019    Examination-Activity Limitations  Sit;Sleep;Bed Mobility;Bend;Squat;Stairs;Caring for Others;Carry;Stand;Toileting;Transfers;Hygiene/Grooming;Lift;Locomotion Level;Reach Overhead;Dressing    Examination-Participation Restrictions  Church;Cleaning;Shop;Community Activity;Driving;Yard Work;Interpersonal Relationship;Laundry;Meal Prep    Stability/Clinical Decision Making  Evolving/Moderate complexity    Clinical Decision Making  Moderate    Rehab Potential  Good    PT Frequency  1x / week    PT Duration  3 weeks    PT Treatment/Interventions  ADLs/Self Care Home Management;Cryotherapy;Electrical Stimulation;Moist Heat;Traction;Balance training;Therapeutic exercise;Therapeutic activities;Functional mobility training;Stair training;Gait training;Ultrasound;DME Instruction;Neuromuscular  re-education;Patient/family education;Orthotic Fit/Training;Manual techniques;Vasopneumatic Device;Taping;Energy conservation;Dry needling;Passive range of motion    PT Next Visit Plan  administer HEP    Consulted and Agree with Plan of Care  Patient       Patient will benefit from skilled therapeutic intervention in order to improve the following deficits and impairments:  Decreased activity tolerance, Decreased strength, Increased fascial restricitons, Pain, Increased muscle spasms, Difficulty walking, Decreased range of motion, Improper body mechanics, Postural dysfunction, Impaired flexibility  Visit Diagnosis: Acute midline low back pain with right-sided sciatica - Plan: PT plan of care cert/re-cert  Muscle weakness (generalized) - Plan: PT plan of care cert/re-cert  Difficulty in walking, not elsewhere classified - Plan: PT plan of care cert/re-cert  Other abnormalities of gait and mobility - Plan: PT plan of care cert/re-cert     Problem List Patient Active Problem List   Diagnosis Date Noted  . Proteinuria 08/17/2018  . Influenza B 08/17/2018  . Hypokalemia 08/17/2018  . Obesity (BMI 30-39.9) 08/17/2018  . Rhabdomyolysis 08/16/2018    Janene Harvey, PT, DPT 05/15/19 7:58 AM   [facility] [address]  [place], [place], [address] Phone: [phone]   Fax:  [phone]  Name: RIDDICK NUON MRN: [id number] Date of Birth: [birth year]   PHYSICAL THERAPY DISCHARGE SUMMARY  Visits from Start of Care: 1  Current functional level related to goals / functional outcomes: Unable to assess; patient did not return   Remaining deficits: Unable to assess   Education / Equipment: HEP  Plan: Patient agrees to discharge.  Patient goals were not met. Patient is being discharged due to not returning since the last visit.  ?????     Janene Harvey, PT, DPT 06/27/19 3:13 PM

## 2019-05-14 NOTE — Patient Instructions (Signed)

## 2019-05-28 ENCOUNTER — Ambulatory Visit: Payer: Medicaid Other | Attending: Orthopedic Surgery

## 2019-06-04 ENCOUNTER — Ambulatory Visit: Payer: Medicaid Other

## 2019-06-11 ENCOUNTER — Ambulatory Visit: Payer: Medicaid Other | Admitting: Physical Therapy

## 2019-08-08 ENCOUNTER — Emergency Department (HOSPITAL_BASED_OUTPATIENT_CLINIC_OR_DEPARTMENT_OTHER)
Admission: EM | Admit: 2019-08-08 | Discharge: 2019-08-08 | Disposition: A | Discharge status: Home / Self Care | Payer: Medicaid Other | Attending: Emergency Medicine | Admitting: Emergency Medicine

## 2019-08-08 ENCOUNTER — Encounter (HOSPITAL_BASED_OUTPATIENT_CLINIC_OR_DEPARTMENT_OTHER): Payer: Self-pay | Admitting: Emergency Medicine

## 2019-08-08 ENCOUNTER — Other Ambulatory Visit: Payer: Self-pay

## 2019-08-08 DIAGNOSIS — F17210 Nicotine dependence, cigarettes, uncomplicated: Secondary | ICD-10-CM | POA: Insufficient documentation

## 2019-08-08 DIAGNOSIS — M546 Pain in thoracic spine: Secondary | ICD-10-CM | POA: Insufficient documentation

## 2019-08-08 DIAGNOSIS — Z79899 Other long term (current) drug therapy: Secondary | ICD-10-CM | POA: Insufficient documentation

## 2019-08-08 DIAGNOSIS — I10 Essential (primary) hypertension: Secondary | ICD-10-CM | POA: Diagnosis not present

## 2019-08-08 MED ORDER — KETOROLAC TROMETHAMINE 60 MG/2ML IM SOLN
30.0000 mg | Freq: Once | INTRAMUSCULAR | Status: AC
  Administered 2019-08-08: 30 mg via INTRAMUSCULAR
  Filled 2019-08-08: qty 2

## 2019-08-08 NOTE — Discharge Instructions (Signed)
Please continue Tylenol and muscle relaxers prescribed to you Make a follow up appointment with Dr. Yevette Edwards Return if you are worsening

## 2019-08-08 NOTE — ED Notes (Signed)
Patient is A&Ox4 at this time.  Patient in no signs of distress.  Please see providers note for complete history and physical exam.  

## 2019-08-08 NOTE — ED Provider Notes (Signed)
[facility] EMERGENCY DEPARTMENT Provider Note   CSN: [id number] Arrival date & time: 08/08/19  1204     History Chief Complaint  Patient presents with  . Back Pain    Paul Rios is a 34 y.o. male with chronic back pain presents with back pain. He states that his back has been hurting him for about one week. It starts in his mid back and radiates up and down his back. The patient is constant and severe. He can't get comfortable or sleep at night. He has been taking Tylenol and muscle relaxers without significant relief. Movement of his head makes the pain worse. The pain will sometimes radiate in to the right hip/thigh at times. He has previously seen Dr Yevette Edwards and is s/p L5-S1 microdisectomy in 2019.   HPI     Past Medical History:  Diagnosis Date  . Hypertension     Patient Active Problem List   Diagnosis Date Noted  . Proteinuria 08/17/2018  . Influenza B 08/17/2018  . Hypokalemia 08/17/2018  . Obesity (BMI 30-39.9) 08/17/2018  . Rhabdomyolysis 08/16/2018    Past Surgical History:  Procedure Laterality Date  . INCISE AND DRAIN ABCESS     in mouth, 2015  . LUMBAR LAMINECTOMY/DECOMPRESSION MICRODISCECTOMY Left 03/22/2018   Procedure: LEFT-SIDED LUMBAR 5-SACRUM 1 MICRODISECTOMY;  Surgeon: Estill Bamberg, MD;  Location: [facility] OR;  Service: Orthopedics;  Laterality: Left;       No family history on file.  Social History   Tobacco Use  . Smoking status: Current Every Day Smoker    Packs/day: 0.50    Types: Cigarettes  . Smokeless tobacco: Never Used  Substance Use Topics  . Alcohol use: Never  . Drug use: Yes    Types: Marijuana    Home Medications Prior to Admission medications   Medication Sig Start Date End Date Taking? Authorizing Provider  acetaminophen (TYLENOL) 500 MG tablet Take 1,000 mg by mouth every 6 (six) hours as needed for mild pain.    [provider]  albuterol (PROVENTIL HFA;VENTOLIN HFA) 108 (90 Base) MCG/ACT inhaler  Inhale 2 puffs into the lungs every 4 (four) hours as needed for wheezing. 08/15/18   [provider]  clindamycin (CLEOCIN) 150 MG capsule Take 3 capsules (450 mg total) by mouth 3 (three) times daily. Patient not taking: Reported on 08/17/2018 03/28/18   Hedges, Tinnie Gens, PA-C  lidocaine (XYLOCAINE) 2 % solution Use as directed 15 mLs in the mouth or throat as needed for mouth pain. Patient not taking: Reported on 05/14/2019 09/28/18   Henderly, Britni A, PA-C  Phenyleph-Doxylamine-DM-APAP (NYQUIL SEVERE+ VAPOCOOL) 5-6.25-10-325 MG/15ML LIQD Take 10 mLs by mouth every 4 (four) hours as needed (cold symptoms).    [provider]  Pseudoephedrine-APAP-DM (TYLENOL COLD/FLU SEVERE DAY PO) Take 2 capsules by mouth every 6 (six) hours as needed (cough).    [provider]    Allergies    Augmentin [amoxicillin-pot clavulanate]  Review of Systems   Review of Systems  Musculoskeletal: Positive for back pain.  Neurological: Negative for weakness and numbness.    Physical Exam Updated Vital Signs BP (!) 132/104 (BP Location: Right Arm)   Pulse 76   Temp 98.8 F (37.1 C) (Oral)   Resp 18   Ht 6\' 1"  (1.854 m)   Wt (!) 138.3 kg   SpO2 98%   BMI 40.24 kg/m   Physical Exam Vitals and nursing note reviewed.  Constitutional:      General: He is not  in acute distress.    Appearance: Normal appearance. He is well-developed. He is not ill-appearing.     Comments: Uncomfortable appearing  HENT:     Head: Normocephalic and atraumatic.  Eyes:     General: No scleral icterus.       Right eye: No discharge.        Left eye: No discharge.     Conjunctiva/sclera: Conjunctivae normal.     Pupils: Pupils are equal, round, and reactive to light.  Cardiovascular:     Rate and Rhythm: Normal rate.  Pulmonary:     Effort: Pulmonary effort is normal. No respiratory distress.  Abdominal:     General: There is no distension.  Musculoskeletal:     Cervical back: Normal range of  motion.     Comments: Back: Inspection: No masses, deformity, or rash. Prior surgical scar over low back Palpation: No focal tenderness. General back tenderness. Strength: 5/5 in lower extremities and normal plantar and dorsiflexion Gait: Normal gait   Skin:    General: Skin is warm and dry.  Neurological:     Mental Status: He is alert and oriented to person, place, and time.  Psychiatric:        Behavior: Behavior normal.     ED Results / Procedures / Treatments   Labs (all labs ordered are listed, but only abnormal results are displayed) Labs Reviewed - No data to display  EKG None  Radiology No results found.  Procedures Procedures (including critical care time)  Medications Ordered in ED Medications  ketorolac (TORADOL) injection 30 mg (30 mg Intramuscular Given 08/08/19 1430)    ED Course  I have reviewed the triage vital signs and the nursing notes.  Pertinent labs & imaging results that were available during my care of the patient were reviewed by me and considered in my medical decision making (see chart for details).  34 year old male with acute on chronic back pain. He is hypertensive but otherwise vitals are normal. He is generally tender on exam. He is ambulatory without difficulty. He already has muscle relaxers at home. I advised we can't give narcotics for chronic back pain. He states he does not want this but wants a Toradol shot. He also states he cannot tolerated oral steroids. He was advised to f/u with Dr. Lynann Bologna.   MDM Rules/Calculators/A&P                       Final Clinical Impression(s) / ED Diagnoses Final diagnoses:  Midline thoracic back pain, unspecified chronicity    Rx / DC Orders ED Discharge Orders    None       Recardo Evangelist, PA-C 08/08/19 1438    Charlesetta Shanks, MD 08/10/19 [phone]

## 2019-08-08 NOTE — ED Triage Notes (Signed)
Upper mid back pain since last week  Pain has come and gone for more than that worse over the last week , pain goes down rt leg he states hurts to sit and sleep

## 2019-08-17 ENCOUNTER — Other Ambulatory Visit: Payer: Self-pay | Admitting: Orthopedic Surgery

## 2019-08-17 DIAGNOSIS — M5416 Radiculopathy, lumbar region: Secondary | ICD-10-CM

## 2019-08-18 IMAGING — CR DG CHEST 2V
2 series · 2 of 2 positions shown · non-contrast
Comparison: None.

CLINICAL DATA: Preoperative study prior to lumbar surgery.

EXAM:
CHEST - 2 VIEW

[w chest pa]
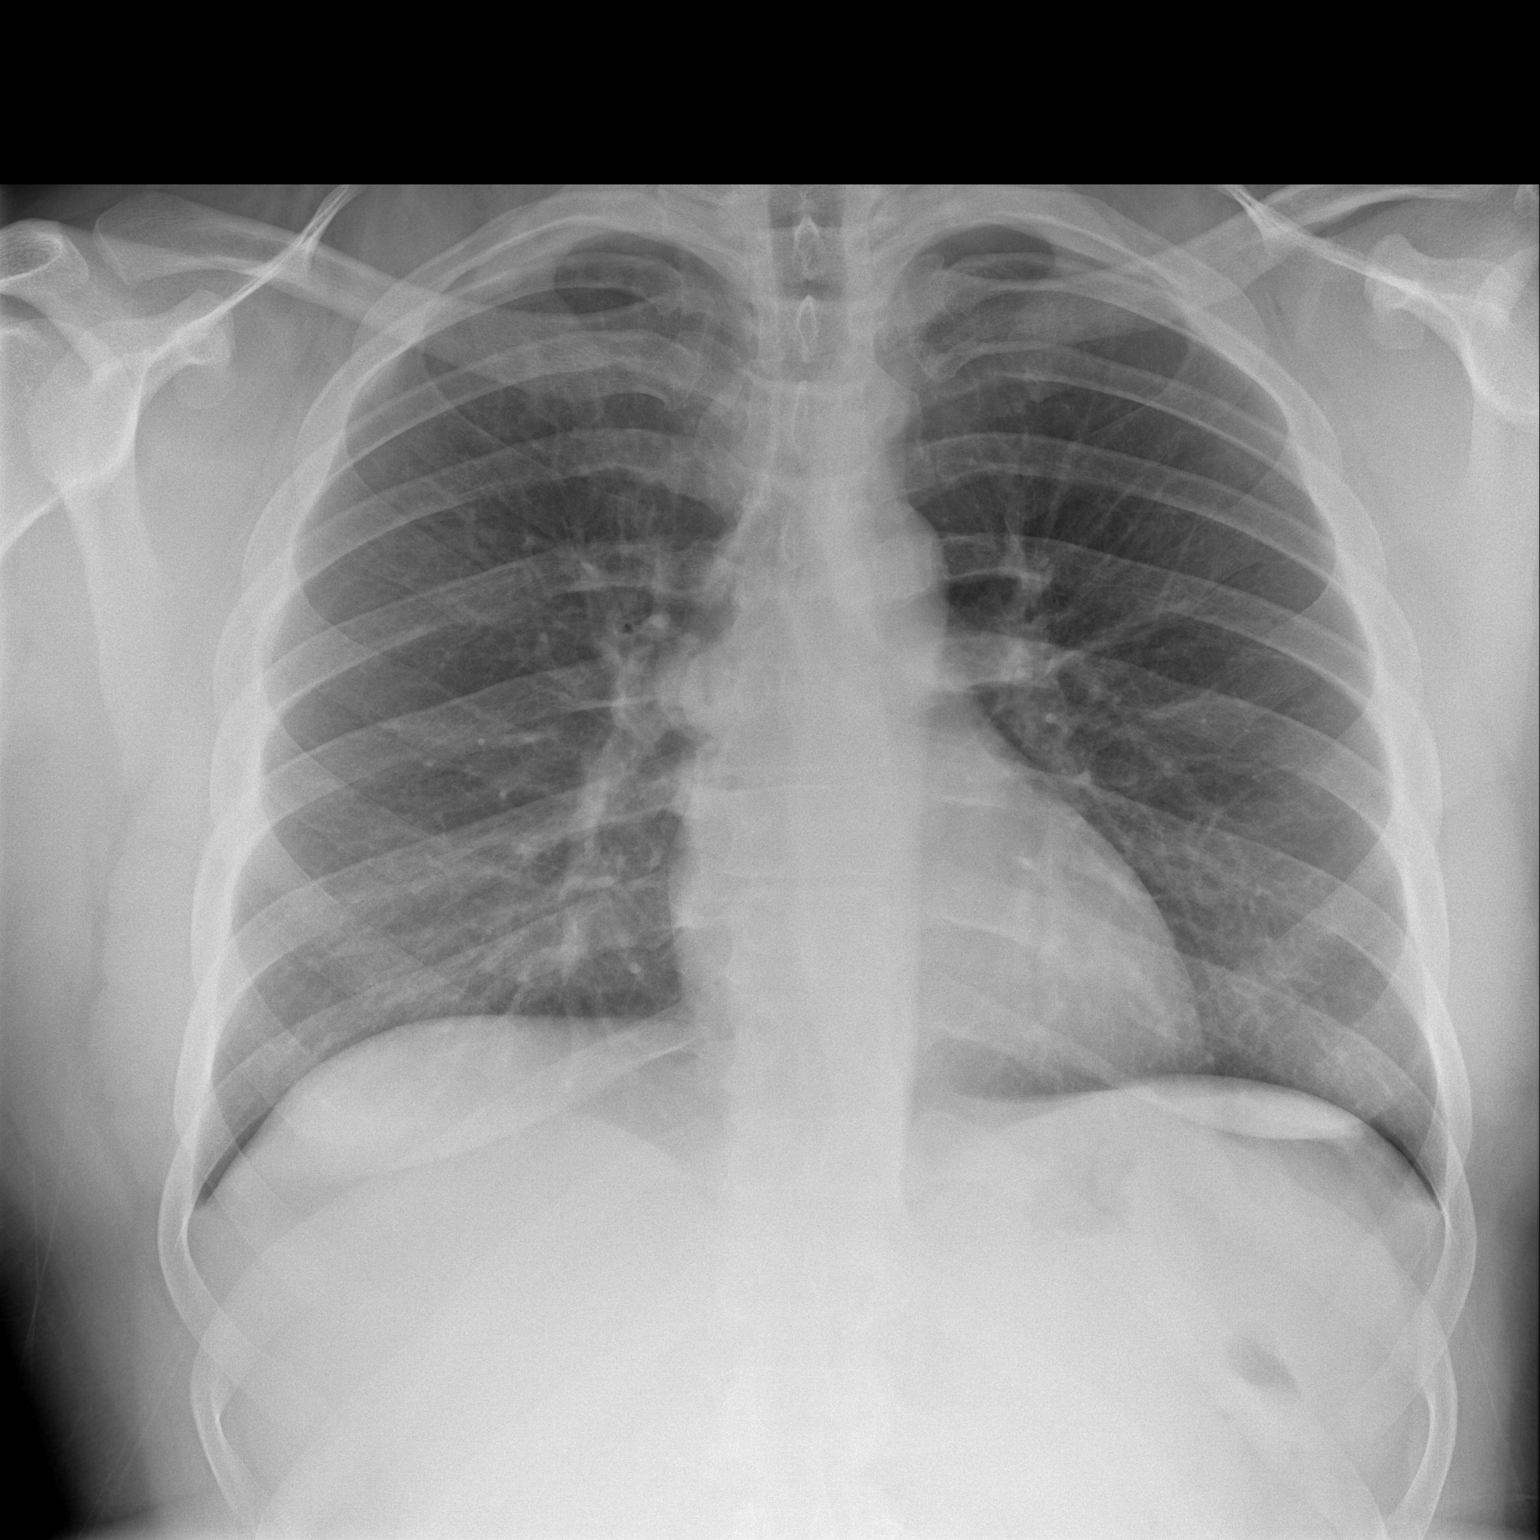

[w chest lat]
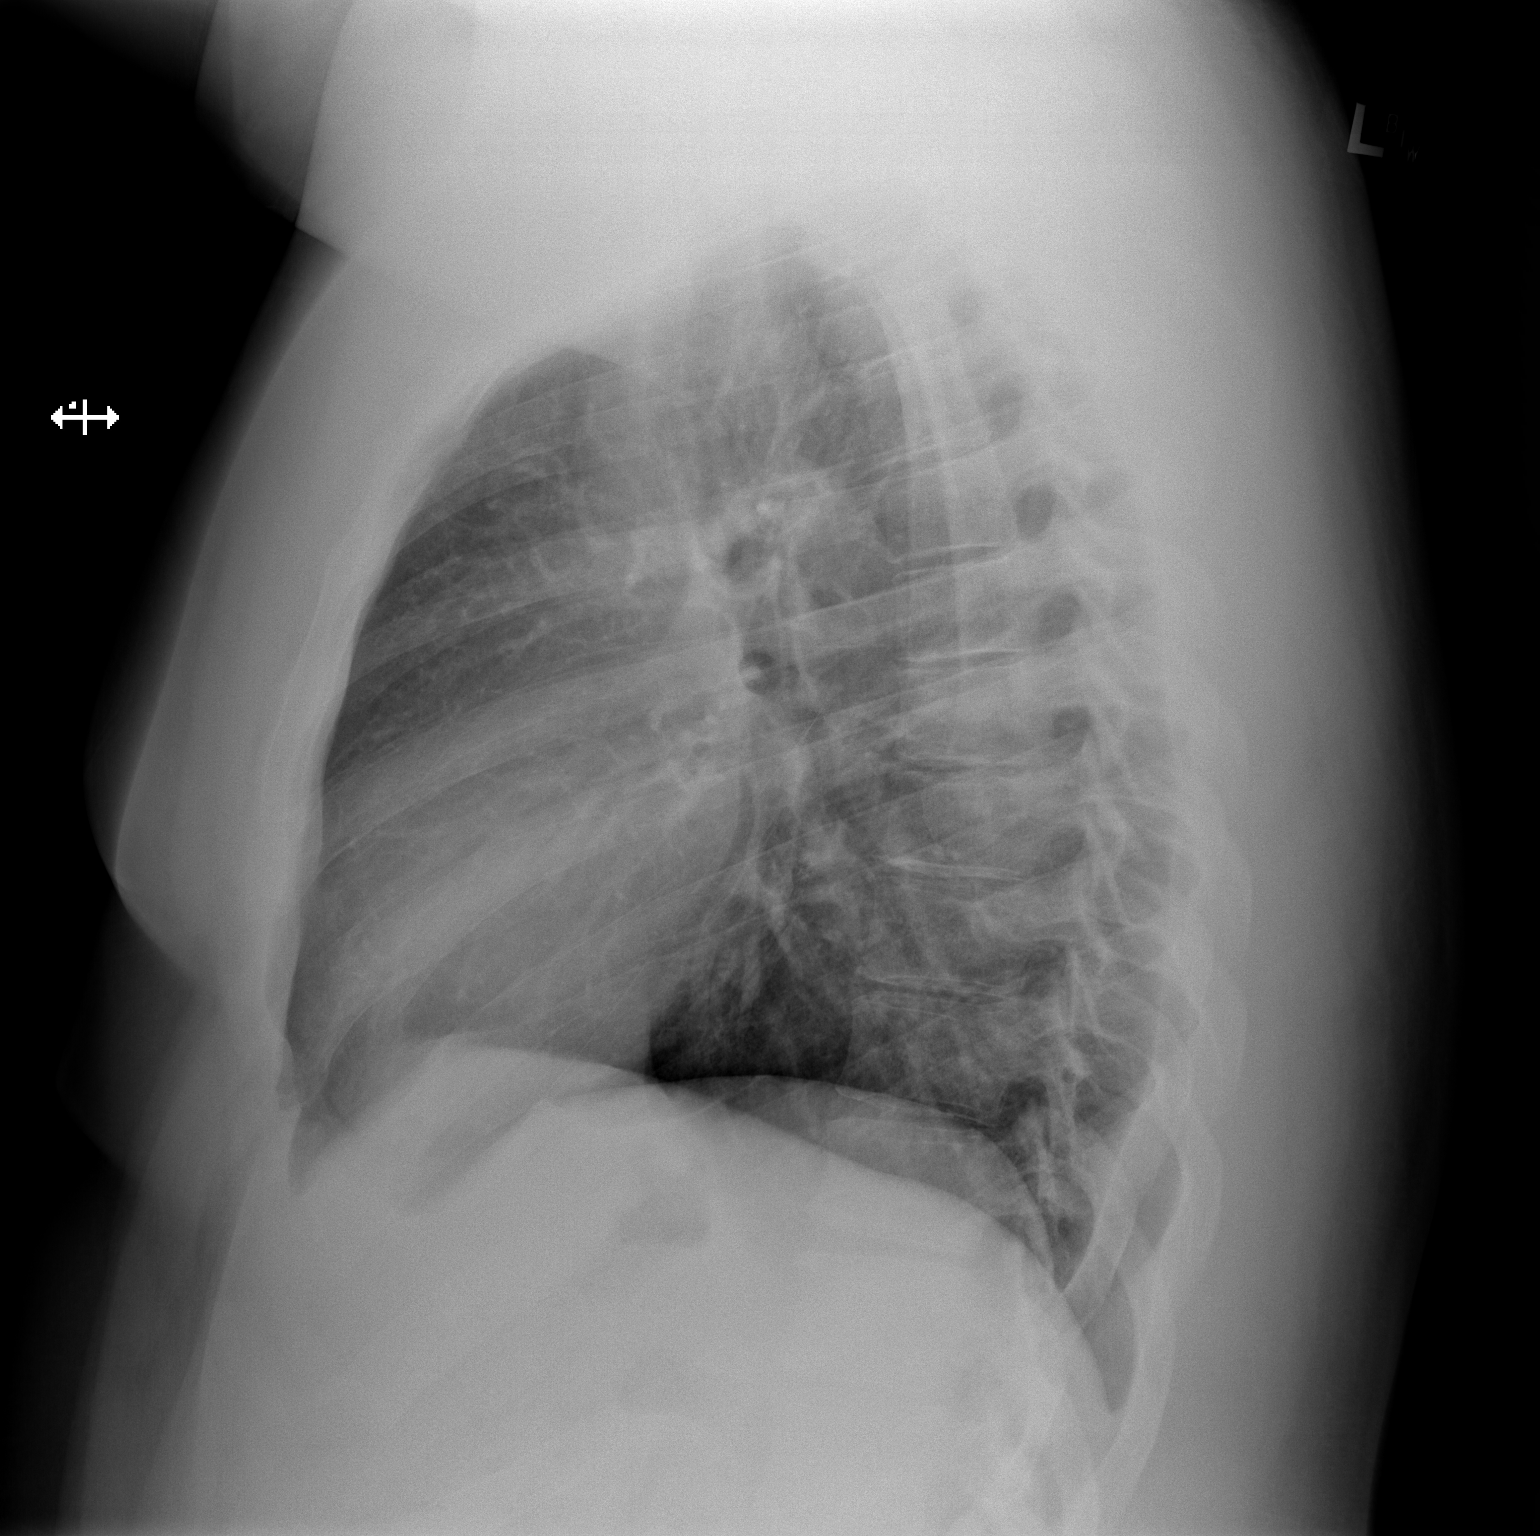

[2 of 2 positions shown; findings below may reference images not displayed]

FINDINGS: The heart size and mediastinal contours are within normal limits.
Both lungs are clear. The visualized skeletal structures are
unremarkable.
IMPRESSION: No active cardiopulmonary disease.

## 2019-08-25 IMAGING — DX DG CHEST 2V
2 series · 2 of 2 positions shown · non-contrast
Comparison: 03/21/2018

CLINICAL DATA: Fever, throat pain

EXAM:
CHEST - 2 VIEW

[chest pa]
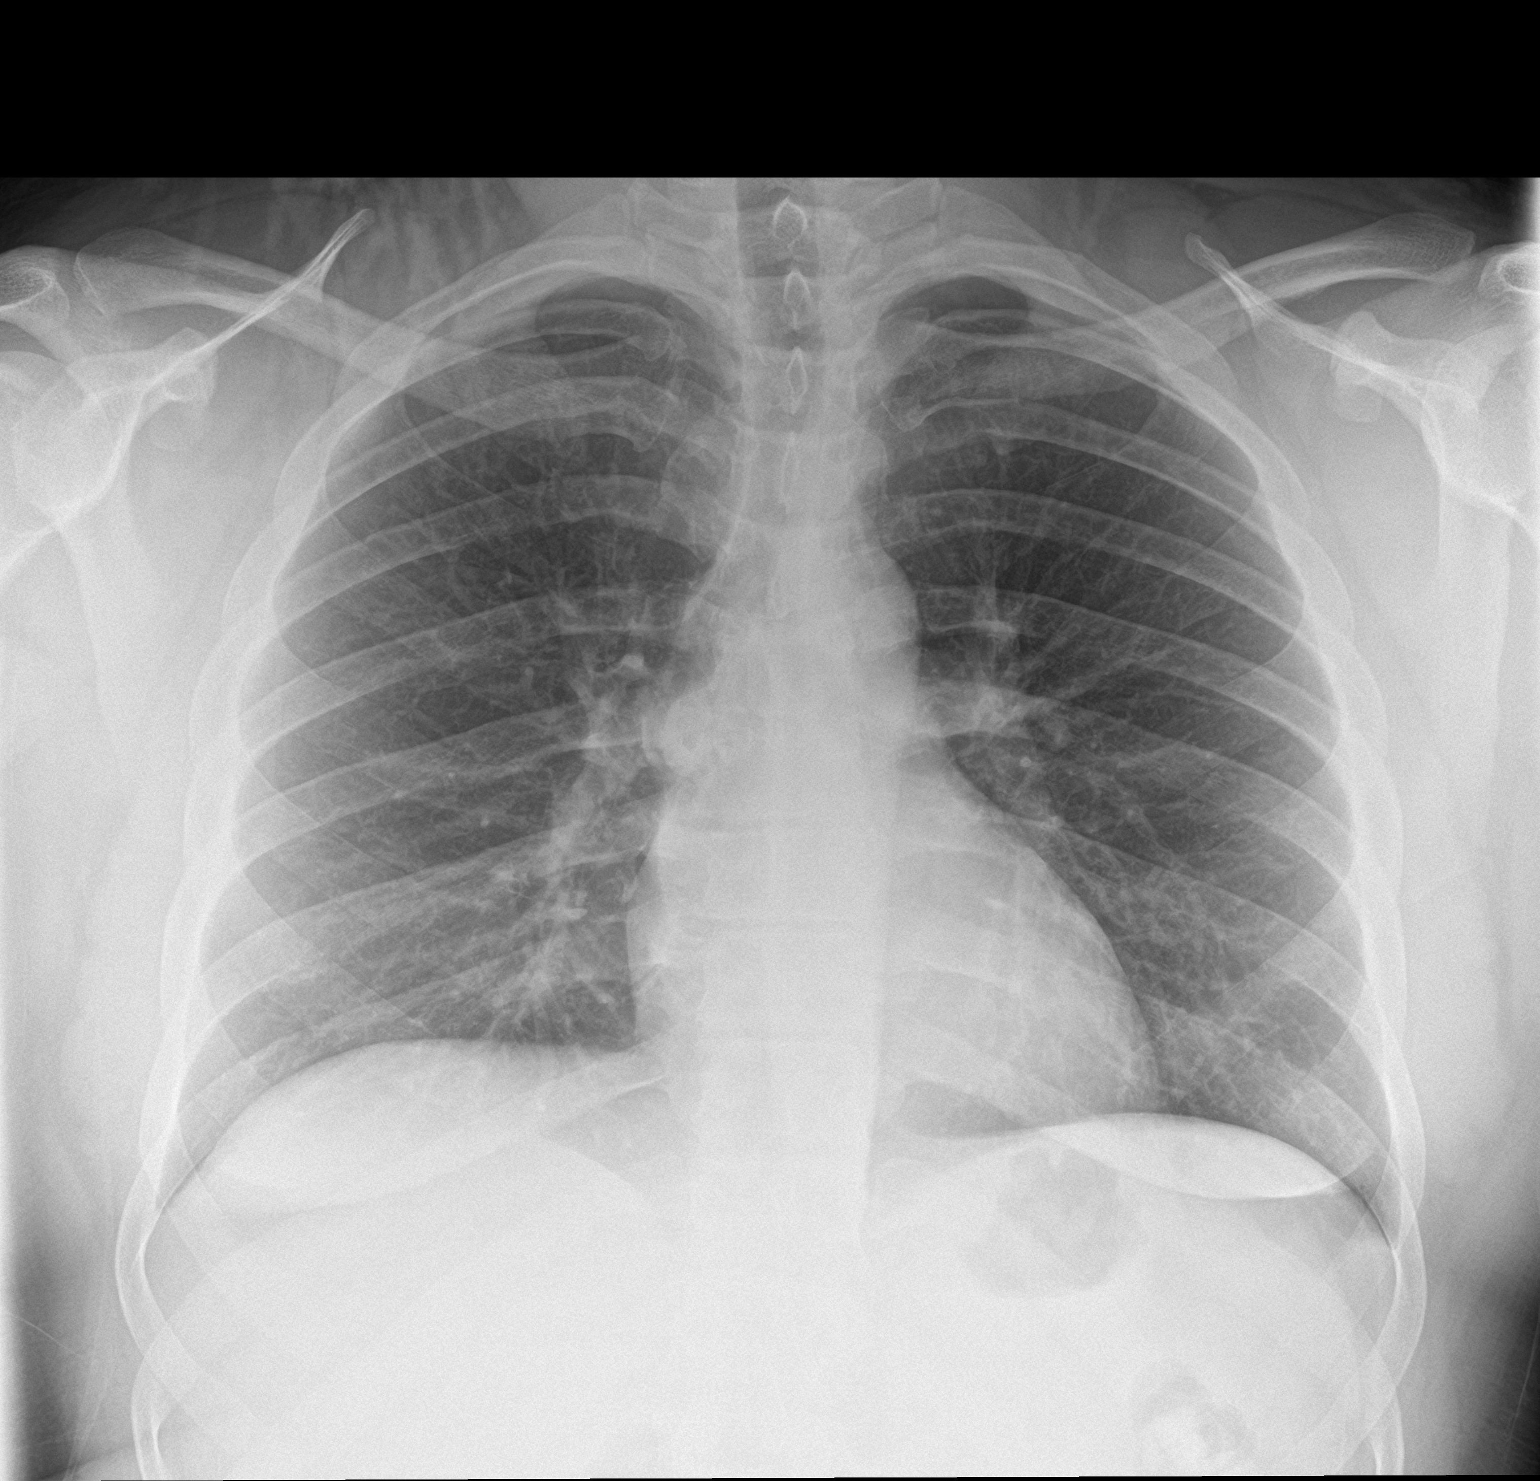

[chest lat]
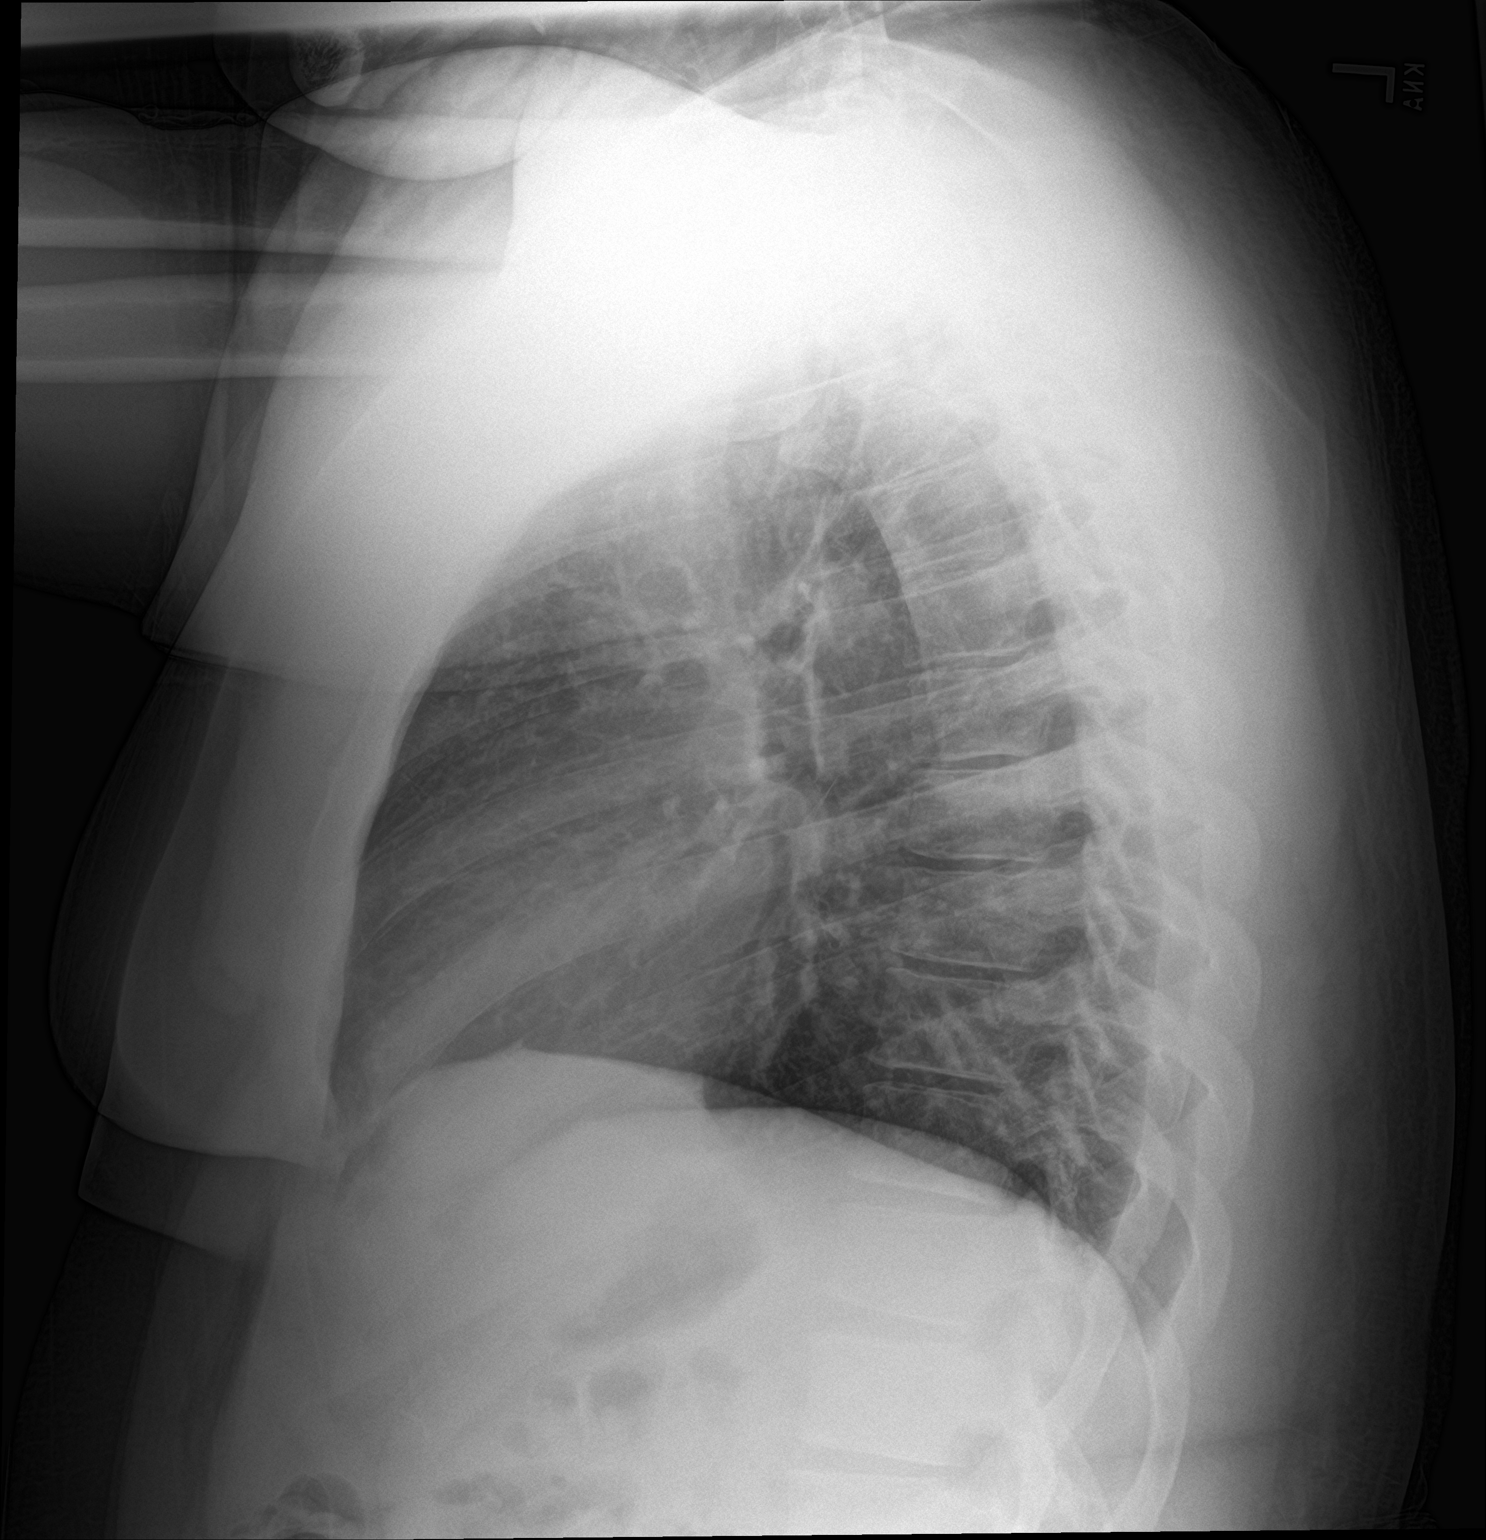

[2 of 2 positions shown; findings below may reference images not displayed]

FINDINGS: Heart and mediastinal contours are within normal limits. No focal
opacities or effusions. No acute bony abnormality.
IMPRESSION: No active cardiopulmonary disease.

## 2019-08-27 ENCOUNTER — Other Ambulatory Visit: Payer: Self-pay

## 2019-08-27 ENCOUNTER — Ambulatory Visit
Admission: RE | Admit: 2019-08-27 | Discharge: 2019-08-27 | Disposition: A | Discharge status: Home / Self Care | Payer: Medicaid Other | Source: Ambulatory Visit | Attending: Orthopedic Surgery | Admitting: Orthopedic Surgery

## 2019-08-27 DIAGNOSIS — M5416 Radiculopathy, lumbar region: Secondary | ICD-10-CM

## 2020-01-14 IMAGING — US US ABDOMEN LIMITED
1 series · 14 of 25 positions shown · non-contrast
Comparison: None.

CLINICAL DATA: Abnormal LFTs

EXAM:
ULTRASOUND ABDOMEN LIMITED RIGHT UPPER QUADRANT

[Series 1: us abdomen limited · 14 of 54 slices shown]
[im 1/54]
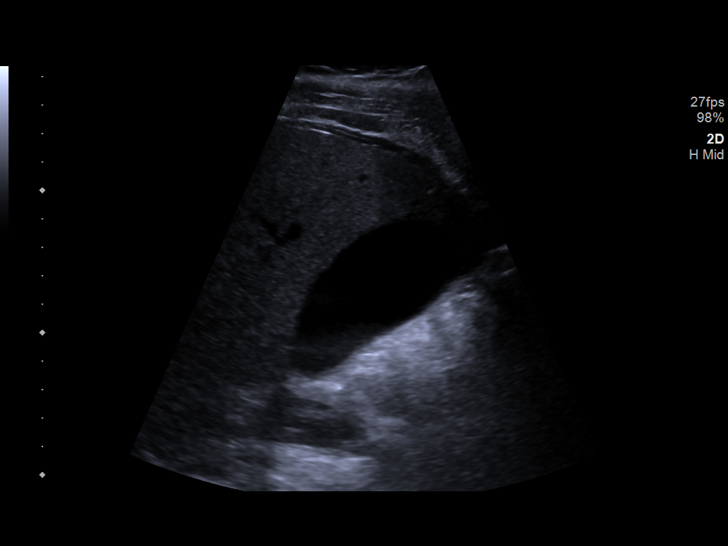
[im 5/54]
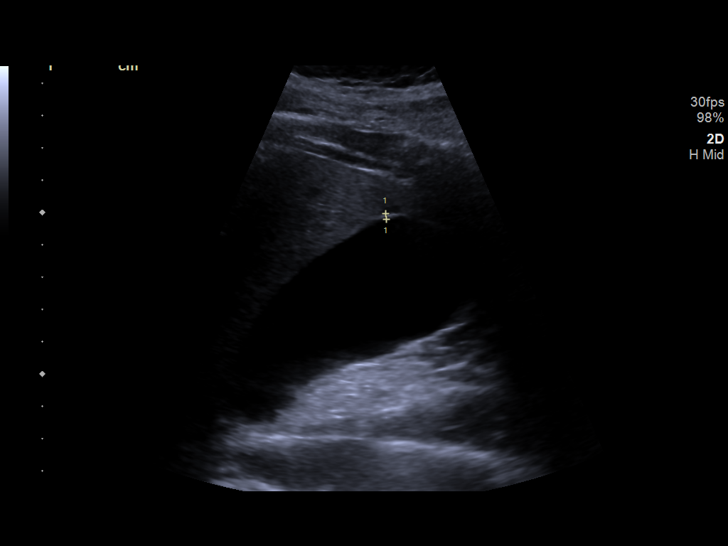
[im 9/54]
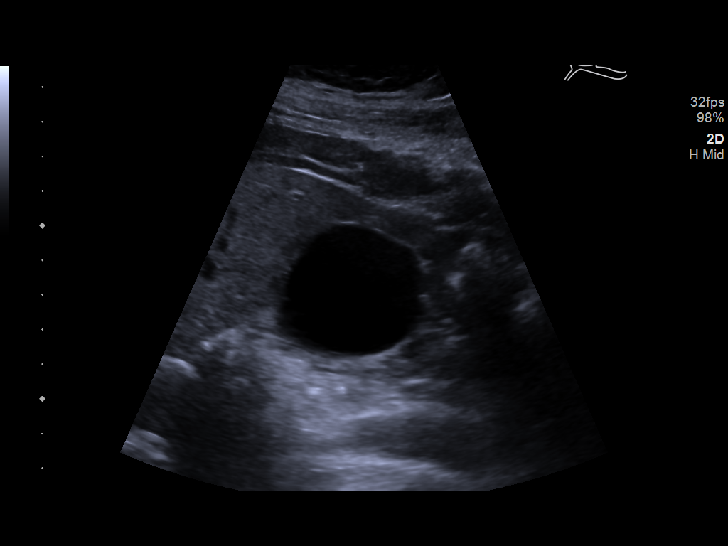
[im 14/54]
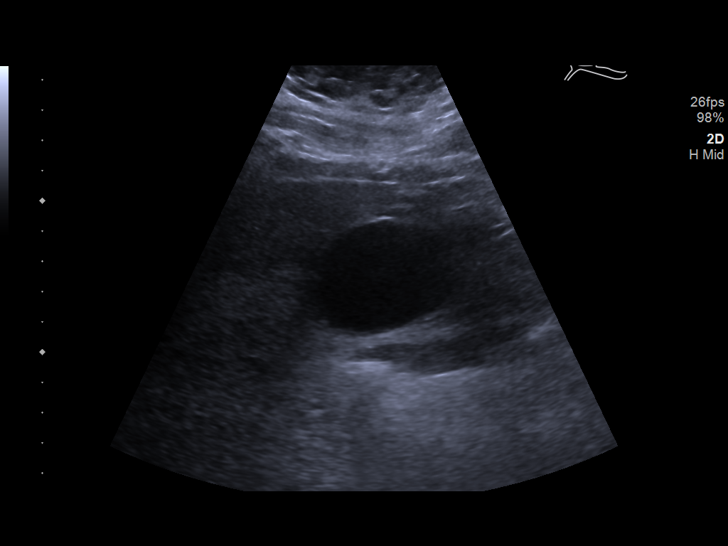
[im 18/54]
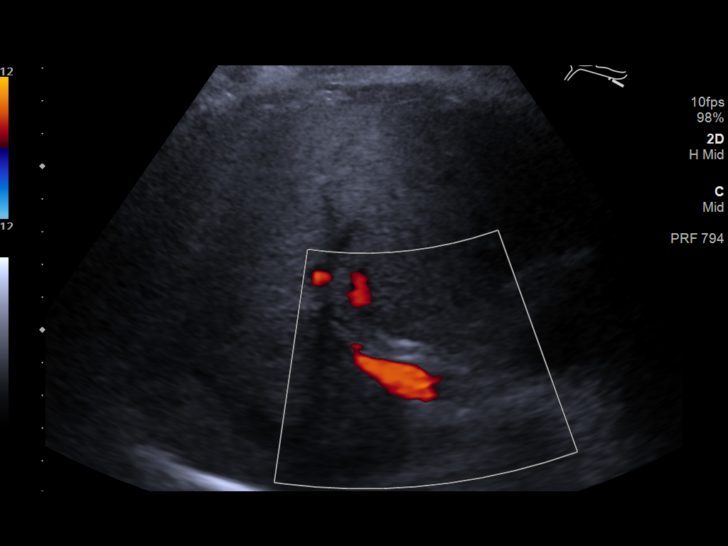
[im 20/54]
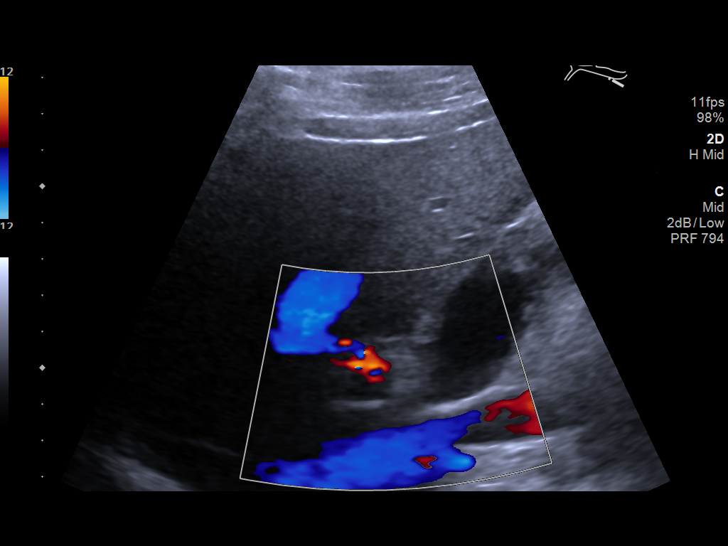
[im 25/54]
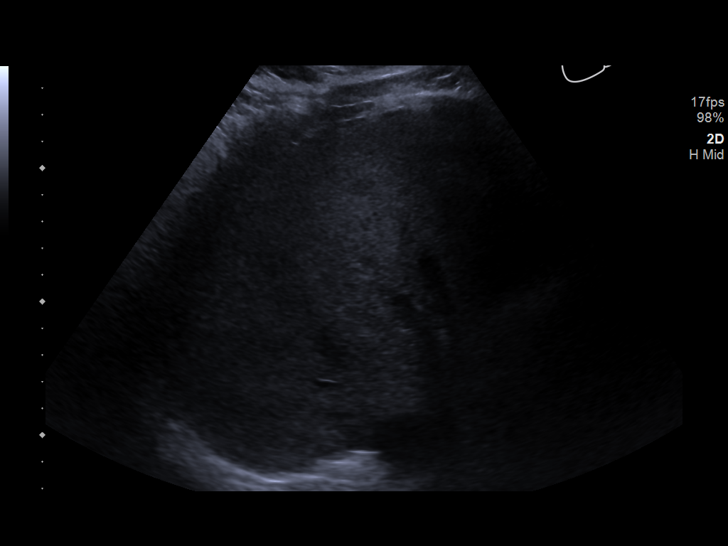
[im 29/54]
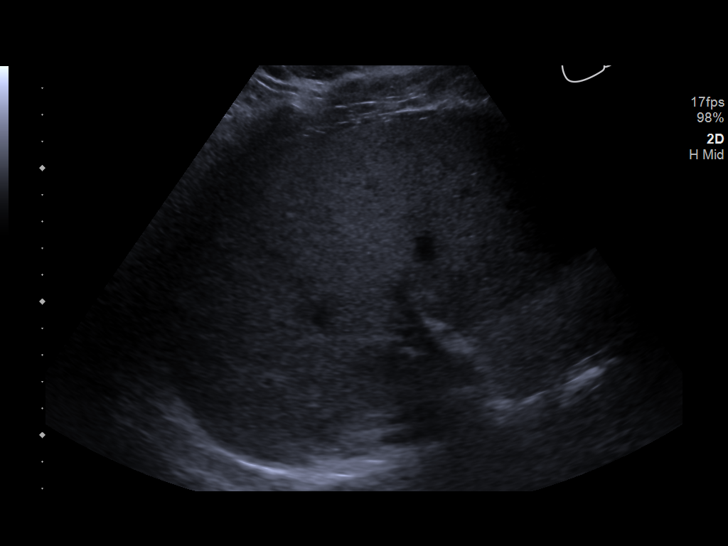
[im 34/54]
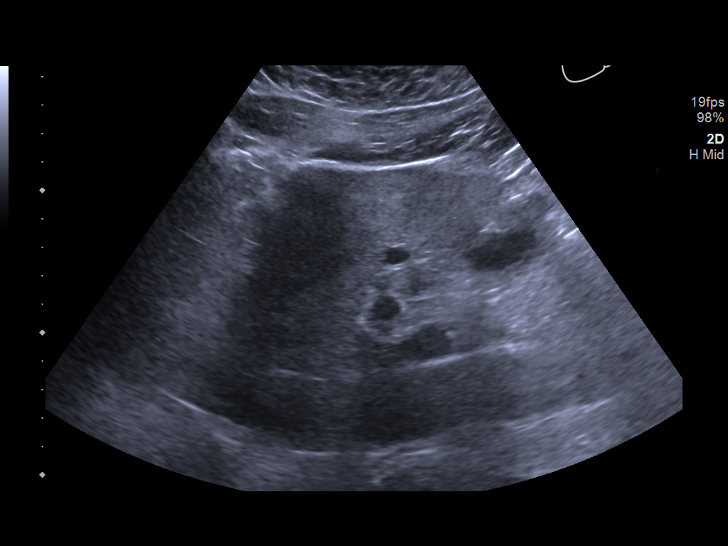
[im 36/54]
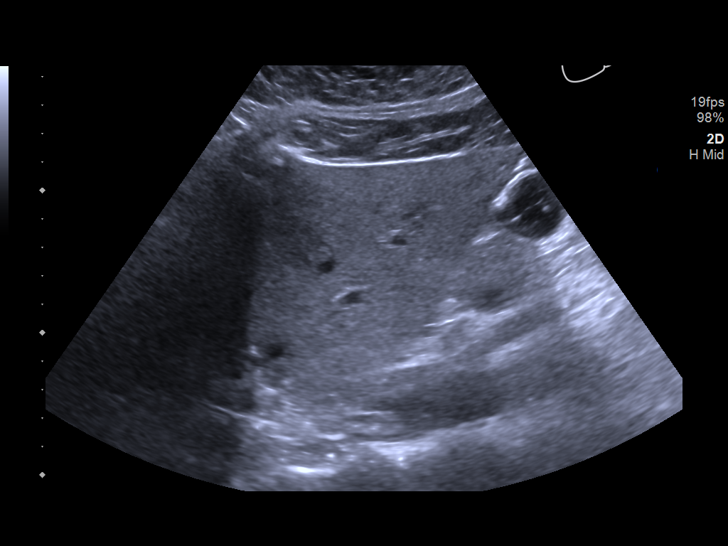
[im 40/54]
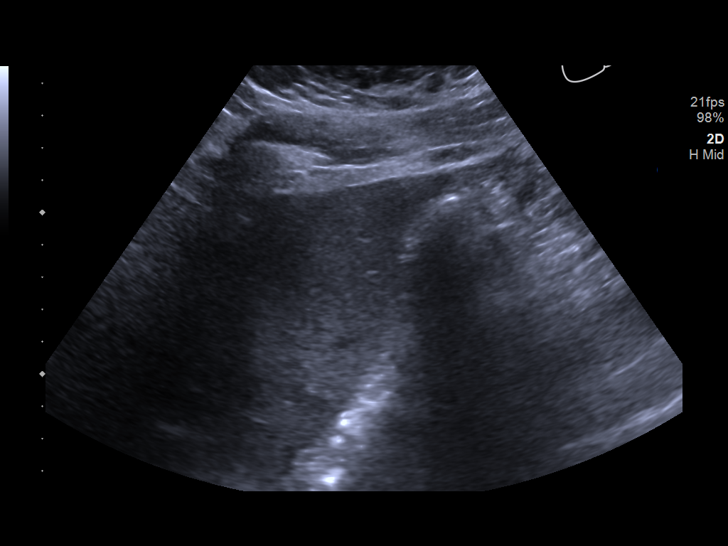
[im 45/54]
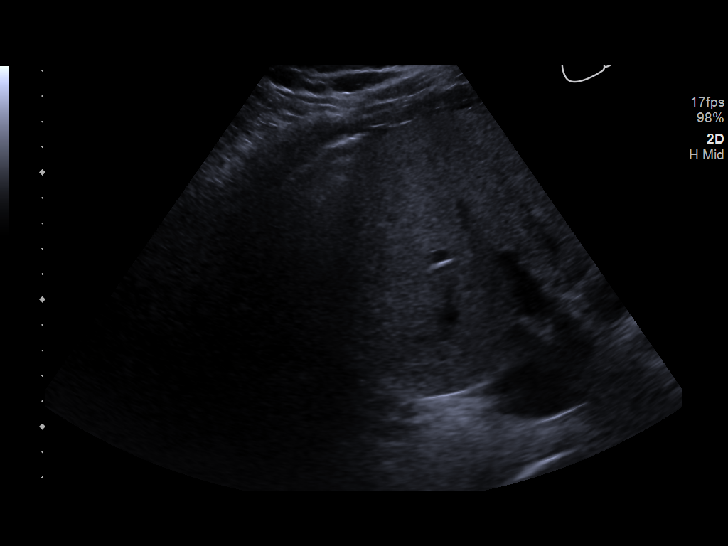
[im 49/54]
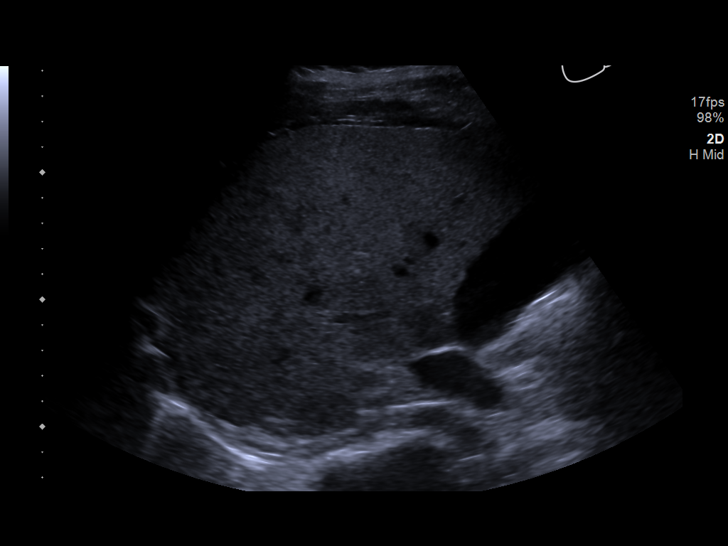
[im 54/54]
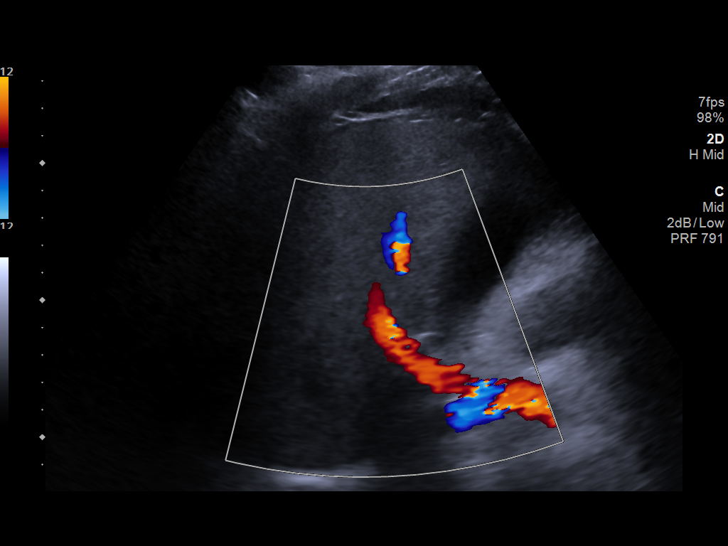

[14 of 25 positions shown; findings below may reference images not displayed]

FINDINGS: Gallbladder:

No gallstones or wall thickening visualized. No sonographic Murphy
sign noted by sonographer.

Common bile duct:

Diameter: 3.3 mm

Liver:

Heterogeneous increased echotexture with no focal mass. Portal vein
is patent on color Doppler imaging with normal direction of blood
flow towards the liver.
IMPRESSION: Increased heterogeneous echotexture in the liver without a focal
mass is nonspecific but may represent hepatic steatosis. No other
abnormalities.
